# Patient Record
Sex: Female | Born: 1980 | Hispanic: No | Marital: Married | Smoking: Never smoker
Health system: Southern US, Community
[De-identification: ages and names within clinical notes are randomized; demographics above are authoritative.]

## PROBLEM LIST (undated history)

## (undated) DIAGNOSIS — F32A Depression, unspecified: Secondary | ICD-10-CM

## (undated) DIAGNOSIS — F329 Major depressive disorder, single episode, unspecified: Secondary | ICD-10-CM

## (undated) HISTORY — DX: Major depressive disorder, single episode, unspecified: F32.9

## (undated) HISTORY — DX: Depression, unspecified: F32.A

## (undated) HISTORY — PX: NO PAST SURGERIES: SHX2092

## (undated) HISTORY — PX: OTHER SURGICAL HISTORY: SHX169

---

## 2004-05-31 ENCOUNTER — Encounter: Admission: RE | Admit: 2004-05-31 | Discharge: 2004-05-31 | Payer: Self-pay | Admitting: Specialist

## 2006-09-26 ENCOUNTER — Ambulatory Visit (HOSPITAL_COMMUNITY): Admission: RE | Admit: 2006-09-26 | Discharge: 2006-09-26 | Payer: Self-pay | Admitting: Obstetrics and Gynecology

## 2014-11-10 ENCOUNTER — Other Ambulatory Visit: Payer: Self-pay | Admitting: *Deleted

## 2014-11-10 DIAGNOSIS — Z3490 Encounter for supervision of normal pregnancy, unspecified, unspecified trimester: Secondary | ICD-10-CM

## 2014-11-10 NOTE — Progress Notes (Signed)
Spoke with patients husband who stated that patients lmp is 09/26/14. Ultrasound scheduled for 11/18/13. Husband aware. Front desk to call patient with ob appointment.

## 2014-11-14 NOTE — L&D Delivery Note (Signed)
Patient is 34 y.o. [id number] [redacted]w[redacted]d admitted for labor, no significant medical history.   Delivery Note At 2:42 PM a viable female was delivered via Vaginal, Spontaneous Delivery (Presentation: Right Occiput Anterior).  APGAR: 9, 9; weight not yet measured.   Placenta status: Intact, Spontaneous.  Cord: 3 vessels with the following complications: None.  Cord pH: not sent.  Anesthesia: Epidural  Episiotomy: None Lacerations: 2nd degree Suture Repair: 3.0 vicryl Est. Blood Loss (mL): 125  Mom to postpartum.  Baby to Couplet care / Skin to Skin.  Tarri Abernethy 07/07/2015, 4:03 PM      Upon arrival patient was complete and pushing. She pushed with good maternal effort to deliver a healthy baby boy. Baby delivered without difficulty, was noted to have good tone and place on maternal abdomen for oral suctioning, drying and stimulation. Delayed cord clamping performed. Placenta delivered intact with 3V cord. Vaginal canal and perineum was inspected. Second degree perineal laceration was repaired with 3.0 vicryl. Pitocin was started and uterus massaged until bleeding slowed. Counts of sharps, instruments, and lap pads were all correct.   Tarri Abernethy, MD PGY-1 [facility] 07/07/2015

## 2014-11-17 ENCOUNTER — Encounter: Payer: Self-pay | Admitting: *Deleted

## 2014-11-18 ENCOUNTER — Ambulatory Visit (HOSPITAL_COMMUNITY): Payer: Self-pay

## 2014-11-20 ENCOUNTER — Encounter (HOSPITAL_COMMUNITY): Payer: Self-pay

## 2014-11-20 ENCOUNTER — Ambulatory Visit (HOSPITAL_COMMUNITY)
Admission: RE | Admit: 2014-11-20 | Discharge: 2014-11-20 | Disposition: A | Discharge status: Home / Self Care | Payer: BLUE CROSS/BLUE SHIELD | Source: Ambulatory Visit | Attending: Family Medicine | Admitting: Family Medicine

## 2014-11-20 DIAGNOSIS — O26891 Other specified pregnancy related conditions, first trimester: Secondary | ICD-10-CM | POA: Insufficient documentation

## 2014-11-20 DIAGNOSIS — Z36 Encounter for antenatal screening of mother: Secondary | ICD-10-CM | POA: Insufficient documentation

## 2014-11-20 DIAGNOSIS — Z3490 Encounter for supervision of normal pregnancy, unspecified, unspecified trimester: Secondary | ICD-10-CM

## 2014-11-20 DIAGNOSIS — R102 Pelvic and perineal pain: Secondary | ICD-10-CM | POA: Diagnosis not present

## 2014-12-17 ENCOUNTER — Encounter: Payer: Self-pay | Admitting: Advanced Practice Midwife

## 2014-12-17 ENCOUNTER — Ambulatory Visit (INDEPENDENT_AMBULATORY_CARE_PROVIDER_SITE_OTHER): Payer: BLUE CROSS/BLUE SHIELD | Admitting: Advanced Practice Midwife

## 2014-12-17 VITALS — BP 96/64 | HR 82 | Temp 97.8°F | Ht 63.0 in | Wt 126.4 lb

## 2014-12-17 DIAGNOSIS — O3680X1 Pregnancy with inconclusive fetal viability, fetus 1: Secondary | ICD-10-CM

## 2014-12-17 DIAGNOSIS — Z3401 Encounter for supervision of normal first pregnancy, first trimester: Secondary | ICD-10-CM

## 2014-12-17 DIAGNOSIS — Z3480 Encounter for supervision of other normal pregnancy, unspecified trimester: Secondary | ICD-10-CM | POA: Insufficient documentation

## 2014-12-17 DIAGNOSIS — Z3481 Encounter for supervision of other normal pregnancy, first trimester: Secondary | ICD-10-CM

## 2014-12-17 DIAGNOSIS — Z3400 Encounter for supervision of normal first pregnancy, unspecified trimester: Secondary | ICD-10-CM | POA: Insufficient documentation

## 2014-12-17 LAB — POCT URINALYSIS DIP (DEVICE)
BILIRUBIN URINE: NEGATIVE
Glucose, UA: NEGATIVE mg/dL
Ketones, ur: NEGATIVE mg/dL
LEUKOCYTES UA: NEGATIVE
Nitrite: NEGATIVE
PROTEIN: NEGATIVE mg/dL
Specific Gravity, Urine: 1.02 (ref 1.005–1.030)
UROBILINOGEN UA: 1 mg/dL (ref 0.0–1.0)
pH: 7 (ref 5.0–8.0)

## 2014-12-17 LAB — US OB COMP LESS 14 WKS

## 2014-12-17 MED ORDER — CVS PRENATAL GUMMY 0.4-113.5 MG PO CHEW: 2.0000 | CHEWABLE_TABLET | Freq: Every day | ORAL | Status: DC

## 2014-12-17 NOTE — Progress Notes (Signed)
Initial OB appt. New OB educational material given Labs, Flu vaccine 09/2014 Interpreter present for encounter.  Discussed with patient that this is a teaching facility and that during their pregnancy there may be female physicians and other healthcare providers involved in their care. This includes, but is not limited to, prenatal visits and ultrasound examinations, as well as, the labor and delivery process and postpartum care. Also discussed with patient that they do have the right to transfer their care to another practice in the event that they do not agree or wish to see female providers under any situation. Informed patient that we will make every attempt to have a female provider care for them, though this cannot be guaranteed, such as in an emergent situation. Also, reminded patient that when they are scheduling their appointments, to request a female provider each time and we will try to accommodate their request.

## 2014-12-17 NOTE — Patient Instructions (Signed)
Nausea medication to take during pregnancy:  ° °Unisom (doxylamine succinate 25 mg tablets) Take one tablet daily at bedtime. If symptoms are not adequately controlled, the dose can be increased to a maximum recommended dose of two tablets daily (1/2 tablet in the morning, 1/2 tablet mid-afternoon and one at bedtime). ° °Vitamin B6 100mg tablets. Take one tablet twice a day (up to 200 mg per day). ° ° ° °Morning Sickness °Morning sickness is when you feel sick to your stomach (nauseous) during pregnancy. This nauseous feeling may or may not come with vomiting. It often occurs in the morning but can be a problem any time of day. Morning sickness is most common during the first trimester, but it may continue throughout pregnancy. While morning sickness is unpleasant, it is usually harmless unless you develop severe and continual vomiting (hyperemesis gravidarum). This condition requires more intense treatment.  °CAUSES  °The cause of morning sickness is not completely known but seems to be related to normal hormonal changes that occur in pregnancy. °RISK FACTORS °You are at greater risk if you: °· Experienced nausea or vomiting before your pregnancy. °· Had morning sickness during a previous pregnancy. °· Are pregnant with more than one baby, such as twins. °TREATMENT  °Do not use any medicines (prescription, over-the-counter, or herbal) for morning sickness without first talking to your health care provider. Your health care provider may prescribe or recommend: °· Vitamin B6 supplements. °· Anti-nausea medicines. °· The herbal medicine ginger. °HOME CARE INSTRUCTIONS  °· Only take over-the-counter or prescription medicines as directed by your health care provider. °· Taking multivitamins before getting pregnant can prevent or decrease the severity of morning sickness in most women. °· Eat a piece of dry toast or unsalted crackers before getting out of bed in the morning. °· Eat five or six small meals a day. °· Eat  dry and bland foods (rice, baked potato). Foods high in carbohydrates are often helpful. °· Do not drink liquids with your meals. Drink liquids between meals. °· Avoid greasy, fatty, and spicy foods. °· Get someone to cook for you if the smell of any food causes nausea and vomiting. °· If you feel nauseous after taking prenatal vitamins, take the vitamins at night or with a snack.  °· Snack on protein foods (nuts, yogurt, cheese) between meals if you are hungry. °· Eat unsweetened gelatins for desserts. °· Wearing an acupressure wristband (worn for sea sickness) may be helpful. °· Acupuncture may be helpful. °· Do not smoke. °· Get a humidifier to keep the air in your house free of odors. °· Get plenty of fresh air. °SEEK MEDICAL CARE IF:  °· Your home remedies are not working, and you need medicine. °· You feel dizzy or lightheaded. °· You are losing weight. °SEEK IMMEDIATE MEDICAL CARE IF:  °· You have persistent and uncontrolled nausea and vomiting. °· You pass out (faint). °MAKE SURE YOU: °· Understand these instructions. °· Will watch your condition. °· Will get help right away if you are not doing well or get worse. °Document Released: 12/22/2006 Document Revised: 11/05/2013 Document Reviewed: 04/17/2013 °ExitCare® Patient Information ©2015 [employer]. This information is not intended to replace advice given to you by your health care provider. Make sure you discuss any questions you have with your health care provider. ° °

## 2014-12-17 NOTE — Progress Notes (Signed)
   Subjective:    Jenna Duncan is a [id number] 5780w5d being seen today for her first obstetrical visit.  Her obstetrical history is significant for NSVD x 2. Patient does intend to breast feed. Pregnancy history fully reviewed.  Patient reports nausea.  Filed Vitals:   12/17/14 1024 12/17/14 1025  BP: 96/64   Pulse: 82   Temp: 97.8 F (36.6 C)   Height:  5\' 3"  (1.6 m)  Weight: 126 lb 6.4 oz (57.335 kg)     HISTORY: OB History  Gravida Para Term Preterm AB SAB TAB Ectopic Multiple Living  3 2 2       4     # Outcome Date GA Lbr Len/2nd Weight Sex Delivery Anes PTL Lv  3 Current           2 Term 02/06/07 4726w0d  7 lb 5 oz (3.317 kg) M Vag-Spont EPI N Y  1 Term 11/04/03 4026w0d  6 lb 5 oz (2.863 kg) F Vag-Spont  N Y     Past Medical History  Diagnosis Date  . Depression     During pregnancy   History reviewed. No pertinent past surgical history. History reviewed. No pertinent family history.   Exam    Uterus:   ~12 week size  Pelvic Exam:    Perineum: No Hemorrhoids, Normal Perineum   Vulva: normal   Vagina:  normal mucosa, normal discharge   pH:    Cervix: multiparous appearance, no bleeding following Pap and no lesions   Adnexa: normal adnexa and no mass, fullness, tenderness   Bony Pelvis: average  System: Breast:  normal appearance, no masses or tenderness   Skin: normal coloration and turgor, no rashes    Neurologic: oriented, normal, gait normal; reflexes normal and symmetric   Extremities: normal strength, tone, and muscle mass, ROM of all joints is normal   HEENT neck supple with midline trachea and thyroid without masses   Mouth/Teeth mucous membranes moist, pharynx normal without lesions and dental hygiene good   Neck supple and no masses   Cardiovascular: regular rate and rhythm   Respiratory:  appears well, vitals normal, no respiratory distress, acyanotic, normal RR, ear and throat exam is normal, neck free of mass or lymphadenopathy   Abdomen: soft,  non-tender; bowel sounds normal; no masses,  no organomegaly   Urinary: urethral meatus normal      Assessment:    Pregnancy: [id number] Patient Active Problem List   Diagnosis Date Noted  . Supervision of normal first pregnancy 12/17/2014        Plan:     Initial labs drawn. Prenatal vitamins. Discussed gummy vitamins while nausea is bothersome.   Problem list reviewed and updated. Genetic Screening discussed First Screen and Quad Screen: declined.  Ultrasound discussed; fetal survey: requested.  Follow up in 4 weeks. 50% of 30 min visit spent on counseling and coordination of care.     LEFTWICH-KIRBY, LISA 12/17/2014

## 2014-12-17 NOTE — Progress Notes (Signed)
Bedside US for viability - single IUP, FHR = 168, FM present.  Results given to Sharen CounterLisa Leftwich-Kirby, CNM

## 2014-12-18 LAB — PRENATAL PROFILE (SOLSTAS)
Antibody Screen: NEGATIVE
BASOS ABS: 0 10*3/uL (ref 0.0–0.1)
Basophils Relative: 0 % (ref 0–1)
Eosinophils Absolute: 0.2 10*3/uL (ref 0.0–0.7)
Eosinophils Relative: 2 % (ref 0–5)
HEMATOCRIT: 33.8 % — AB (ref 36.0–46.0)
HEMOGLOBIN: 11 g/dL — AB (ref 12.0–15.0)
HEP B S AG: NEGATIVE
HIV 1&2 Ab, 4th Generation: NONREACTIVE
LYMPHS PCT: 20 % (ref 12–46)
Lymphs Abs: 1.5 10*3/uL (ref 0.7–4.0)
MCH: 27.1 pg (ref 26.0–34.0)
MCHC: 32.5 g/dL (ref 30.0–36.0)
MCV: 83.3 fL (ref 78.0–100.0)
MONO ABS: 0.7 10*3/uL (ref 0.1–1.0)
MONOS PCT: 9 % (ref 3–12)
MPV: 9.7 fL (ref 8.6–12.4)
NEUTROS ABS: 5.3 10*3/uL (ref 1.7–7.7)
Neutrophils Relative %: 69 % (ref 43–77)
PLATELETS: 256 10*3/uL (ref 150–400)
RBC: 4.06 MIL/uL (ref 3.87–5.11)
RDW: 16.3 % — AB (ref 11.5–15.5)
RH TYPE: POSITIVE
RUBELLA: 1.41 {index} — AB (ref <0.90)
WBC: 7.7 10*3/uL (ref 4.0–10.5)

## 2014-12-18 LAB — PRESCRIPTION MONITORING PROFILE (19 PANEL)
AMPHETAMINE/METH: NEGATIVE ng/mL
BARBITURATE SCREEN, URINE: NEGATIVE ng/mL
BUPRENORPHINE, URINE: NEGATIVE ng/mL
Benzodiazepine Screen, Urine: NEGATIVE ng/mL
CANNABINOID SCRN UR: NEGATIVE ng/mL
CARISOPRODOL, URINE: NEGATIVE ng/mL
Cocaine Metabolites: NEGATIVE ng/mL
Creatinine, Urine: 216.13 mg/dL (ref >20.0)
ECSTASY: NEGATIVE ng/mL
FENTANYL URINE: NEGATIVE ng/mL
MEPERIDINE UR: NEGATIVE ng/mL
METHADONE SCREEN, URINE: NEGATIVE ng/mL
METHAQUALONE SCREEN (URINE): NEGATIVE ng/mL
NITRITES URINE, INITIAL: NEGATIVE ug/mL
OPIATE SCREEN, URINE: NEGATIVE ng/mL
Oxycodone Screen, Ur: NEGATIVE ng/mL
Phencyclidine, Ur: NEGATIVE ng/mL
Propoxyphene: NEGATIVE ng/mL
TAPENTADOLUR: NEGATIVE ng/mL
Tramadol Scrn, Ur: NEGATIVE ng/mL
Zolpidem, Urine: NEGATIVE ng/mL
pH, Initial: 7.3 pH (ref 4.5–8.9)

## 2014-12-19 ENCOUNTER — Encounter: Payer: Self-pay | Admitting: *Deleted

## 2014-12-19 DIAGNOSIS — Z3481 Encounter for supervision of other normal pregnancy, first trimester: Secondary | ICD-10-CM

## 2014-12-19 LAB — HEMOGLOBINOPATHY EVALUATION
HEMOGLOBIN OTHER: 0 %
HGB A2 QUANT: 2.5 % (ref 2.2–3.2)
HGB F QUANT: 0 % (ref 0.0–2.0)
Hgb A: 97.5 % (ref 96.8–97.8)
Hgb S Quant: 0 %

## 2014-12-21 LAB — CULTURE, OB URINE

## 2014-12-22 ENCOUNTER — Other Ambulatory Visit: Payer: Self-pay

## 2014-12-22 DIAGNOSIS — Z3401 Encounter for supervision of normal first pregnancy, first trimester: Secondary | ICD-10-CM

## 2014-12-22 LAB — CYTOLOGY - PAP

## 2014-12-22 MED ORDER — CVS PRENATAL GUMMY 0.4-113.5 MG PO CHEW: 2.0000 | CHEWABLE_TABLET | Freq: Every day | ORAL | Status: DC

## 2014-12-22 NOTE — Progress Notes (Signed)
Patient's husband called stating a RX was supposed to have been sent to [facility] on [address] [place], [place] but never was. Per chart review, PNV gummies were prescribed but printed. Medication re-prescribed to e-prescribe. Advised he call clinic if he has any problems-- also informed him that gummy vitamin can be bought OTC if he prefers.

## 2014-12-23 ENCOUNTER — Encounter: Payer: Self-pay | Admitting: Advanced Practice Midwife

## 2014-12-23 DIAGNOSIS — O2340 Unspecified infection of urinary tract in pregnancy, unspecified trimester: Secondary | ICD-10-CM

## 2014-12-23 DIAGNOSIS — B951 Streptococcus, group B, as the cause of diseases classified elsewhere: Secondary | ICD-10-CM | POA: Insufficient documentation

## 2015-01-07 ENCOUNTER — Encounter: Payer: Self-pay | Admitting: Obstetrics & Gynecology

## 2015-01-14 ENCOUNTER — Encounter: Payer: Self-pay | Admitting: Obstetrics and Gynecology

## 2015-01-14 ENCOUNTER — Ambulatory Visit (INDEPENDENT_AMBULATORY_CARE_PROVIDER_SITE_OTHER): Payer: BLUE CROSS/BLUE SHIELD | Admitting: Obstetrics and Gynecology

## 2015-01-14 VITALS — BP 101/58 | HR 82 | Wt 128.1 lb

## 2015-01-14 DIAGNOSIS — Z3482 Encounter for supervision of other normal pregnancy, second trimester: Secondary | ICD-10-CM

## 2015-01-14 LAB — POCT URINALYSIS DIP (DEVICE)
BILIRUBIN URINE: NEGATIVE
GLUCOSE, UA: NEGATIVE mg/dL
HGB URINE DIPSTICK: NEGATIVE
Ketones, ur: NEGATIVE mg/dL
Leukocytes, UA: NEGATIVE
NITRITE: NEGATIVE
Protein, ur: 30 mg/dL — AB
SPECIFIC GRAVITY, URINE: 1.025 (ref 1.005–1.030)
Urobilinogen, UA: 0.2 mg/dL (ref 0.0–1.0)
pH: 7 (ref 5.0–8.0)

## 2015-01-14 NOTE — Progress Notes (Signed)
Patient states she speaks english well and understands english and does not need an interpreter; declination of interpreter services signed with patient

## 2015-01-14 NOTE — Patient Instructions (Signed)
Second Trimester of Pregnancy The second trimester is from week 13 through week 28, months 4 through 6. The second trimester is often a time when you feel your best. Your body has also adjusted to being pregnant, and you begin to feel better physically. Usually, morning sickness has lessened or quit completely, you may have more energy, and you may have an increase in appetite. The second trimester is also a time when the fetus is growing rapidly. At the end of the sixth month, the fetus is about 9 inches long and weighs about 1 pounds. You will likely begin to feel the baby move (quickening) between 18 and 20 weeks of the pregnancy. BODY CHANGES Your body goes through many changes during pregnancy. The changes vary from woman to woman.   Your weight will continue to increase. You will notice your lower abdomen bulging out.  You may begin to get stretch marks on your hips, abdomen, and breasts.  You may develop headaches that can be relieved by medicines approved by your health care provider.  You may urinate more often because the fetus is pressing on your bladder.  You may develop or continue to have heartburn as a result of your pregnancy.  You may develop constipation because certain hormones are causing the muscles that push waste through your intestines to slow down.  You may develop hemorrhoids or swollen, bulging veins (varicose veins).  You may have back pain because of the weight gain and pregnancy hormones relaxing your joints between the bones in your pelvis and as a result of a shift in weight and the muscles that support your balance.  Your breasts will continue to grow and be tender.  Your gums may bleed and may be sensitive to brushing and flossing.  Dark spots or blotches (chloasma, mask of pregnancy) may develop on your face. This will likely fade after the baby is born.  A dark line from your belly button to the pubic area (linea nigra) may appear. This will likely fade  after the baby is born.  You may have changes in your hair. These can include thickening of your hair, rapid growth, and changes in texture. Some women also have hair loss during or after pregnancy, or hair that feels dry or thin. Your hair will most likely return to normal after your baby is born. WHAT TO EXPECT AT YOUR PRENATAL VISITS During a routine prenatal visit:  You will be weighed to make sure you and the fetus are growing normally.  Your blood pressure will be taken.  Your abdomen will be measured to track your baby's growth.  The fetal heartbeat will be listened to.  Any test results from the previous visit will be discussed. Your health care provider may ask you:  How you are feeling.  If you are feeling the baby move.  If you have had any abnormal symptoms, such as leaking fluid, bleeding, severe headaches, or abdominal cramping.  If you have any questions. Other tests that may be performed during your second trimester include:  Blood tests that check for:  Low iron levels (anemia).  Gestational diabetes (between 24 and 28 weeks).  Rh antibodies.  Urine tests to check for infections, diabetes, or protein in the urine.  An ultrasound to confirm the proper growth and development of the baby.  An amniocentesis to check for possible genetic problems.  Fetal screens for spina bifida and Down syndrome. HOME CARE INSTRUCTIONS   Avoid all smoking, herbs, alcohol, and unprescribed   drugs. These chemicals affect the formation and growth of the baby.  Follow your health care provider's instructions regarding medicine use. There are medicines that are either safe or unsafe to take during pregnancy.  Exercise only as directed by your health care provider. Experiencing uterine cramps is a good sign to stop exercising.  Continue to eat regular, healthy meals.  Wear a good support bra for breast tenderness.  Do not use hot tubs, steam rooms, or saunas.  Wear your  seat belt at all times when driving.  Avoid raw meat, uncooked cheese, cat litter boxes, and soil used by cats. These carry germs that can cause birth defects in the baby.  Take your prenatal vitamins.  Try taking a stool softener (if your health care provider approves) if you develop constipation. Eat more high-fiber foods, such as fresh vegetables or fruit and whole grains. Drink plenty of fluids to keep your urine clear or pale yellow.  Take warm sitz baths to soothe any pain or discomfort caused by hemorrhoids. Use hemorrhoid cream if your health care provider approves.  If you develop varicose veins, wear support hose. Elevate your feet for 15 minutes, 3-4 times a day. Limit salt in your diet.  Avoid heavy lifting, wear low heel shoes, and practice good posture.  Rest with your legs elevated if you have leg cramps or low back pain.  Visit your dentist if you have not gone yet during your pregnancy. Use a soft toothbrush to brush your teeth and be gentle when you floss.  A sexual relationship may be continued unless your health care provider directs you otherwise.  Continue to go to all your prenatal visits as directed by your health care provider. SEEK MEDICAL CARE IF:   You have dizziness.  You have mild pelvic cramps, pelvic pressure, or nagging pain in the abdominal area.  You have persistent nausea, vomiting, or diarrhea.  You have a bad smelling vaginal discharge.  You have pain with urination. SEEK IMMEDIATE MEDICAL CARE IF:   You have a fever.  You are leaking fluid from your vagina.  You have spotting or bleeding from your vagina.  You have severe abdominal cramping or pain.  You have rapid weight gain or loss.  You have shortness of breath with chest pain.  You notice sudden or extreme swelling of your face, hands, ankles, feet, or legs.  You have not felt your baby move in over an hour.  You have severe headaches that do not go away with  medicine.  You have vision changes. Document Released: 10/25/2001 Document Revised: 11/05/2013 Document Reviewed: 01/01/2013 ExitCare Patient Information 2015 [employer]. This information is not intended to replace advice given to you by your health care provider. Make sure you discuss any questions you have with your health care provider.  

## 2015-01-14 NOTE — Progress Notes (Signed)
[place], speaks [place]. Doing well except still having a little nausea. Sinus congestion discussed> try Claritin.  Anatomic US scheduled. Declines any genetic screening.

## 2015-01-15 ENCOUNTER — Encounter: Payer: Self-pay | Admitting: *Deleted

## 2015-01-26 ENCOUNTER — Telehealth: Payer: Self-pay | Admitting: *Deleted

## 2015-01-26 NOTE — Telephone Encounter (Signed)
Patient husband called stating patient is having pain in her feet at night please call her back

## 2015-01-26 NOTE — Telephone Encounter (Addendum)
Patients husband called and stated that patient is having leg pain. Would like a call back to see what to do about it. Called patient back and left voicemail that we are returning your call.   1450  I returned the call and spoke with pt's husband while pt was present. Pt has been having pain in both legs below the knee and also has some swelling.   I gave clinic appt for tomorrow and pt's husband agreed and voiced understanding.    Diane Day RNC

## 2015-01-27 ENCOUNTER — Ambulatory Visit (INDEPENDENT_AMBULATORY_CARE_PROVIDER_SITE_OTHER): Payer: BLUE CROSS/BLUE SHIELD | Admitting: Family

## 2015-01-27 VITALS — BP 102/57 | HR 77 | Temp 97.6°F | Wt 128.7 lb

## 2015-01-27 DIAGNOSIS — Z3482 Encounter for supervision of other normal pregnancy, second trimester: Secondary | ICD-10-CM

## 2015-01-27 LAB — POCT URINALYSIS DIP (DEVICE)
Bilirubin Urine: NEGATIVE
Glucose, UA: NEGATIVE mg/dL
HGB URINE DIPSTICK: NEGATIVE
KETONES UR: NEGATIVE mg/dL
LEUKOCYTES UA: NEGATIVE
NITRITE: NEGATIVE
PH: 7 (ref 5.0–8.0)
Protein, ur: NEGATIVE mg/dL
SPECIFIC GRAVITY, URINE: 1.025 (ref 1.005–1.030)
Urobilinogen, UA: 0.2 mg/dL (ref 0.0–1.0)

## 2015-01-27 MED ORDER — MEDICAL COMPRESSION PANTYHOSE MISC: Status: DC

## 2015-01-27 NOTE — Progress Notes (Signed)
Reports having pain in lower legs/feet while sleeping at night.  RX compression hose.  Ultrasound scheduled.  No additional questions or concerns.

## 2015-01-27 NOTE — Patient Instructions (Addendum)
  [facility] [address], [place], [place] [address] [phone]   

## 2015-02-11 ENCOUNTER — Ambulatory Visit (INDEPENDENT_AMBULATORY_CARE_PROVIDER_SITE_OTHER): Payer: BLUE CROSS/BLUE SHIELD | Admitting: Advanced Practice Midwife

## 2015-02-11 ENCOUNTER — Ambulatory Visit (HOSPITAL_COMMUNITY)
Admission: RE | Admit: 2015-02-11 | Discharge: 2015-02-11 | Disposition: A | Discharge status: Home / Self Care | Payer: BLUE CROSS/BLUE SHIELD | Source: Ambulatory Visit | Attending: Obstetrics and Gynecology | Admitting: Obstetrics and Gynecology

## 2015-02-11 VITALS — BP 98/66 | HR 79 | Wt 135.1 lb

## 2015-02-11 DIAGNOSIS — Z3A19 19 weeks gestation of pregnancy: Secondary | ICD-10-CM | POA: Insufficient documentation

## 2015-02-11 DIAGNOSIS — Z3482 Encounter for supervision of other normal pregnancy, second trimester: Secondary | ICD-10-CM

## 2015-02-11 DIAGNOSIS — Z36 Encounter for antenatal screening of mother: Secondary | ICD-10-CM | POA: Diagnosis present

## 2015-02-11 DIAGNOSIS — Z3689 Encounter for other specified antenatal screening: Secondary | ICD-10-CM | POA: Insufficient documentation

## 2015-02-11 LAB — POCT URINALYSIS DIP (DEVICE)
BILIRUBIN URINE: NEGATIVE
GLUCOSE, UA: NEGATIVE mg/dL
Hgb urine dipstick: NEGATIVE
KETONES UR: NEGATIVE mg/dL
NITRITE: NEGATIVE
PH: 6.5 (ref 5.0–8.0)
PROTEIN: NEGATIVE mg/dL
SPECIFIC GRAVITY, URINE: 1.01 (ref 1.005–1.030)
Urobilinogen, UA: 0.2 mg/dL (ref 0.0–1.0)

## 2015-02-11 NOTE — Progress Notes (Signed)
Trace leuks on UA 

## 2015-02-11 NOTE — Progress Notes (Signed)
Doing well.  Good fetal movement, denies vaginal bleeding, LOF,cramping/contractions.  Reviewed anatomy U/S today, wnl with limited views of mouth. Repeat in 6 weeks to complete anatomy.

## 2015-02-12 NOTE — Addendum Note (Signed)
Encounter addended by: Danae Orleanseirdre C Belmira Daley, CNM on: 02/12/2015  8:33 AM<BR>     Documentation filed: Message, Problem List

## 2015-03-04 ENCOUNTER — Ambulatory Visit (INDEPENDENT_AMBULATORY_CARE_PROVIDER_SITE_OTHER): Payer: BLUE CROSS/BLUE SHIELD | Admitting: Physician Assistant

## 2015-03-04 ENCOUNTER — Encounter: Payer: Self-pay | Admitting: Physician Assistant

## 2015-03-04 VITALS — BP 102/61 | HR 92 | Temp 98.0°F | Wt 140.0 lb

## 2015-03-04 DIAGNOSIS — Z3482 Encounter for supervision of other normal pregnancy, second trimester: Secondary | ICD-10-CM

## 2015-03-04 LAB — POCT URINALYSIS DIP (DEVICE)
GLUCOSE, UA: NEGATIVE mg/dL
HGB URINE DIPSTICK: NEGATIVE
Ketones, ur: NEGATIVE mg/dL
LEUKOCYTES UA: NEGATIVE
NITRITE: NEGATIVE
PH: 7.5 (ref 5.0–8.0)
Protein, ur: 30 mg/dL — AB
Specific Gravity, Urine: 1.02 (ref 1.005–1.030)
UROBILINOGEN UA: 0.2 mg/dL (ref 0.0–1.0)

## 2015-03-04 NOTE — Progress Notes (Signed)
Ultrasound scheduled 5/16 @ 930

## 2015-03-04 NOTE — Progress Notes (Signed)
22 weeks, stable.  Denies LOF, VB, dysuria.  Endorses fetal movement.  Continues to have pain in bilat feet from ankles to toes.  She has not trialed compression hose (given rx on 3/15).   OB u/s ordered to complete anatomy RTC 4 weeks.  Do good stretching of lower extremities prior to bedtime and wear compression hose.

## 2015-03-04 NOTE — Patient Instructions (Signed)

## 2015-03-30 ENCOUNTER — Ambulatory Visit (HOSPITAL_COMMUNITY): Payer: BLUE CROSS/BLUE SHIELD

## 2015-03-31 ENCOUNTER — Ambulatory Visit (INDEPENDENT_AMBULATORY_CARE_PROVIDER_SITE_OTHER): Payer: BLUE CROSS/BLUE SHIELD | Admitting: Family

## 2015-03-31 ENCOUNTER — Ambulatory Visit (HOSPITAL_COMMUNITY)
Admission: RE | Admit: 2015-03-31 | Discharge: 2015-03-31 | Disposition: A | Discharge status: Home / Self Care | Payer: BLUE CROSS/BLUE SHIELD | Source: Ambulatory Visit | Attending: Physician Assistant | Admitting: Physician Assistant

## 2015-03-31 VITALS — BP 98/63 | HR 86 | Wt 144.9 lb

## 2015-03-31 DIAGNOSIS — Z3482 Encounter for supervision of other normal pregnancy, second trimester: Secondary | ICD-10-CM | POA: Insufficient documentation

## 2015-03-31 LAB — POCT URINALYSIS DIP (DEVICE)
Bilirubin Urine: NEGATIVE
GLUCOSE, UA: NEGATIVE mg/dL
Hgb urine dipstick: NEGATIVE
Ketones, ur: NEGATIVE mg/dL
NITRITE: NEGATIVE
Protein, ur: NEGATIVE mg/dL
Specific Gravity, Urine: 1.02 (ref 1.005–1.030)
UROBILINOGEN UA: 0.2 mg/dL (ref 0.0–1.0)
pH: 7 (ref 5.0–8.0)

## 2015-03-31 NOTE — Progress Notes (Signed)
Pt unable to do 1 hour today will do lab only visit in next couple of weeks.

## 2015-03-31 NOTE — Progress Notes (Signed)
Doing well; no questions or concerns.  Follow-up ultrasound scheduled for today.

## 2015-04-14 ENCOUNTER — Other Ambulatory Visit: Payer: BLUE CROSS/BLUE SHIELD

## 2015-04-14 DIAGNOSIS — Z3483 Encounter for supervision of other normal pregnancy, third trimester: Secondary | ICD-10-CM

## 2015-04-15 LAB — GLUCOSE TOLERANCE, 1 HOUR (50G) W/O FASTING: GLUCOSE 1 HOUR GTT: 66 mg/dL — AB (ref 70–140)

## 2015-04-15 LAB — CBC
HEMATOCRIT: 31.5 % — AB (ref 36.0–46.0)
Hemoglobin: 10.3 g/dL — ABNORMAL LOW (ref 12.0–15.0)
MCH: 28.9 pg (ref 26.0–34.0)
MCHC: 32.7 g/dL (ref 30.0–36.0)
MCV: 88.2 fL (ref 78.0–100.0)
MPV: 9.4 fL (ref 8.6–12.4)
PLATELETS: 225 10*3/uL (ref 150–400)
RBC: 3.57 MIL/uL — ABNORMAL LOW (ref 3.87–5.11)
RDW: 16.5 % — ABNORMAL HIGH (ref 11.5–15.5)
WBC: 8.2 10*3/uL (ref 4.0–10.5)

## 2015-04-15 LAB — RPR

## 2015-04-15 LAB — HIV ANTIBODY (ROUTINE TESTING W REFLEX): HIV 1&2 Ab, 4th Generation: NONREACTIVE

## 2015-04-28 ENCOUNTER — Encounter: Payer: Self-pay | Admitting: Advanced Practice Midwife

## 2015-04-28 ENCOUNTER — Ambulatory Visit (INDEPENDENT_AMBULATORY_CARE_PROVIDER_SITE_OTHER): Payer: BLUE CROSS/BLUE SHIELD | Admitting: Advanced Practice Midwife

## 2015-04-28 VITALS — BP 97/62 | HR 83 | Temp 98.6°F | Wt 152.9 lb

## 2015-04-28 DIAGNOSIS — Z23 Encounter for immunization: Secondary | ICD-10-CM

## 2015-04-28 DIAGNOSIS — Z3483 Encounter for supervision of other normal pregnancy, third trimester: Secondary | ICD-10-CM

## 2015-04-28 LAB — POCT URINALYSIS DIP (DEVICE)
Bilirubin Urine: NEGATIVE
GLUCOSE, UA: NEGATIVE mg/dL
Hgb urine dipstick: NEGATIVE
Ketones, ur: NEGATIVE mg/dL
Nitrite: NEGATIVE
PROTEIN: NEGATIVE mg/dL
Specific Gravity, Urine: 1.02 (ref 1.005–1.030)
UROBILINOGEN UA: 0.2 mg/dL (ref 0.0–1.0)
pH: 7.5 (ref 5.0–8.0)

## 2015-04-28 MED ORDER — TETANUS-DIPHTH-ACELL PERTUSSIS 5-2.5-18.5 LF-MCG/0.5 IM SUSP
0.5000 mL | Freq: Once | INTRAMUSCULAR | Status: AC
  Administered 2015-04-28: 0.5 mL via INTRAMUSCULAR

## 2015-04-28 NOTE — Addendum Note (Signed)
Addended by: Louanna Raw on: 04/28/2015 10:24 AM   Modules accepted: Orders

## 2015-04-28 NOTE — Progress Notes (Addendum)
Reviewed 28 week labs. TDaP today. F/U US complete.

## 2015-04-28 NOTE — Patient Instructions (Addendum)
Tdap Vaccine (Tetanus, Diphtheria, Pertussis): What You Need to Know 1. Why get vaccinated? Tetanus, diphtheria and pertussis can be very serious diseases, even for adolescents and adults. Tdap vaccine can protect us from these diseases. TETANUS (Lockjaw) causes painful muscle tightening and stiffness, usually all over the body.  It can lead to tightening of muscles in the head and neck so you can't open your mouth, swallow, or sometimes even breathe. Tetanus kills about 1 out of 5 people who are infected. DIPHTHERIA can cause a thick coating to form in the back of the throat.  It can lead to breathing problems, paralysis, heart failure, and death. PERTUSSIS (Whooping Cough) causes severe coughing spells, which can cause difficulty breathing, vomiting and disturbed sleep.  It can also lead to weight loss, incontinence, and rib fractures. Up to 2 in 100 adolescents and 5 in 100 adults with pertussis are hospitalized or have complications, which could include pneumonia or death. These diseases are caused by bacteria. Diphtheria and pertussis are spread from person to person through coughing or sneezing. Tetanus enters the body through cuts, scratches, or wounds. Before vaccines, the [place] saw as many as 200,000 cases a year of diphtheria and pertussis, and hundreds of cases of tetanus. Since vaccination began, tetanus and diphtheria have dropped by about 99% and pertussis by about 80%. 2. Tdap vaccine Tdap vaccine can protect adolescents and adults from tetanus, diphtheria, and pertussis. One dose of Tdap is routinely given at age 11 or 12. People who did not get Tdap at that age should get it as soon as possible. Tdap is especially important for health care professionals and anyone having close contact with a baby younger than 12 months. Pregnant women should get a dose of Tdap during every pregnancy, to protect the newborn from pertussis. Infants are most at risk for severe, life-threatening  complications from pertussis. A similar vaccine, called Td, protects from tetanus and diphtheria, but not pertussis. A Td booster should be given every 10 years. Tdap may be given as one of these boosters if you have not already gotten a dose. Tdap may also be given after a severe cut or burn to prevent tetanus infection. Your doctor can give you more information. Tdap may safely be given at the same time as other vaccines. 3. Some people should not get this vaccine  If you ever had a life-threatening allergic reaction after a dose of any tetanus, diphtheria, or pertussis containing vaccine, OR if you have a severe allergy to any part of this vaccine, you should not get Tdap. Tell your doctor if you have any severe allergies.  If you had a coma, or long or multiple seizures within 7 days after a childhood dose of DTP or DTaP, you should not get Tdap, unless a cause other than the vaccine was found. You can still get Td.  Talk to your doctor if you:  have epilepsy or another nervous system problem,  had severe pain or swelling after any vaccine containing diphtheria, tetanus or pertussis,  ever had Guillain-Barr Syndrome (GBS),  aren't feeling well on the day the shot is scheduled. 4. Risks of a vaccine reaction With any medicine, including vaccines, there is a chance of side effects. These are usually mild and go away on their own, but serious reactions are also possible. Brief fainting spells can follow a vaccination, leading to injuries from falling. Sitting or lying down for about 15 minutes can help prevent these. Tell your doctor if you feel   dizzy or light-headed, or have vision changes or ringing in the ears. Mild problems following Tdap (Did not interfere with activities)  Pain where the shot was given (about 3 in 4 adolescents or 2 in 3 adults)  Redness or swelling where the shot was given (about 1 person in 5)  Mild fever of at least 100.56F (up to about 1 in 25 adolescents or  1 in 100 adults)  Headache (about 3 or 4 people in 10)  Tiredness (about 1 person in 3 or 4)  Nausea, vomiting, diarrhea, stomach ache (up to 1 in 4 adolescents or 1 in 10 adults)  Chills, body aches, sore joints, rash, swollen glands (uncommon) Moderate problems following Tdap (Interfered with activities, but did not require medical attention)  Pain where the shot was given (about 1 in 5 adolescents or 1 in 100 adults)  Redness or swelling where the shot was given (up to about 1 in 16 adolescents or 1 in 25 adults)  Fever over 102F (about 1 in 100 adolescents or 1 in 250 adults)  Headache (about 3 in 20 adolescents or 1 in 10 adults)  Nausea, vomiting, diarrhea, stomach ache (up to 1 or 3 people in 100)  Swelling of the entire arm where the shot was given (up to about 3 in 100). Severe problems following Tdap (Unable to perform usual activities; required medical attention)  Swelling, severe pain, bleeding and redness in the arm where the shot was given (rare). A severe allergic reaction could occur after any vaccine (estimated less than 1 in a million doses). 5. What if there is a serious reaction? What should I look for?  Look for anything that concerns you, such as signs of a severe allergic reaction, very high fever, or behavior changes. Signs of a severe allergic reaction can include hives, swelling of the face and throat, difficulty breathing, a fast heartbeat, dizziness, and weakness. These would start a few minutes to a few hours after the vaccination. What should I do?  If you think it is a severe allergic reaction or other emergency that can't wait, call 9-1-1 or get the person to the nearest hospital. Otherwise, call your doctor.  Afterward, the reaction should be reported to the "Vaccine Adverse Event Reporting System" (VAERS). Your doctor might file this report, or you can do it yourself through the VAERS web site at www.vaers.LAgents.no, or by calling  1-401-639-5842. VAERS is only for reporting reactions. They do not give medical advice.  6. The National Vaccine Injury Compensation Program The [employer] Vaccine Injury Compensation Program (VICP) is a federal program that was created to compensate people who may have been injured by certain vaccines. Persons who believe they may have been injured by a vaccine can learn about the program and about filing a claim by calling 1-478-767-0610 or visiting the VICP website at SpiritualWord.at. 7. How can I learn more?  Ask your doctor.  Call your local or state health department.  Contact the Centers for Disease Control and Prevention (CDC):  Call 315-563-6003 or visit CDC's website at PicCapture.uy. CDC Tdap Vaccine VIS (03/22/12) Document Released: 05/01/2012 Document Revised: 03/17/2014 Document Reviewed: 02/12/2014 ExitCare Patient Information 2015 [place], [place]. This information is not intended to replace advice given to you by your health care provider. Make sure you discuss any questions you have with your health care provider.   Contraception Choices Contraception (birth control) is the use of any methods or devices to prevent pregnancy. Below are some methods to help avoid  pregnancy. HORMONAL METHODS   Contraceptive implant. This is a thin, plastic tube containing progesterone hormone. It does not contain estrogen hormone. Your health care provider inserts the tube in the inner part of the upper arm. The tube can remain in place for up to 3 years. After 3 years, the implant must be removed. The implant prevents the ovaries from releasing an egg (ovulation), thickens the cervical mucus to prevent sperm from entering the uterus, and thins the lining of the inside of the uterus.  Progesterone-only injections. These injections are given every 3 months by your health care provider to prevent pregnancy. This synthetic progesterone hormone stops the ovaries from  releasing eggs. It also thickens cervical mucus and changes the uterine lining. This makes it harder for sperm to survive in the uterus.  Birth control pills. These pills contain estrogen and progesterone hormone. They work by preventing the ovaries from releasing eggs (ovulation). They also cause the cervical mucus to thicken, preventing the sperm from entering the uterus. Birth control pills are prescribed by a health care provider.Birth control pills can also be used to treat heavy periods.  Minipill. This type of birth control pill contains only the progesterone hormone. They are taken every day of each month and must be prescribed by your health care provider.  Birth control patch. The patch contains hormones similar to those in birth control pills. It must be changed once a week and is prescribed by a health care provider.  Vaginal ring. The ring contains hormones similar to those in birth control pills. It is left in the vagina for 3 weeks, removed for 1 week, and then a new one is put back in place. The patient must be comfortable inserting and removing the ring from the vagina.A health care provider's prescription is necessary.  Emergency contraception. Emergency contraceptives prevent pregnancy after unprotected sexual intercourse. This pill can be taken right after sex or up to 5 days after unprotected sex. It is most effective the sooner you take the pills after having sexual intercourse. Most emergency contraceptive pills are available without a prescription. Check with your pharmacist. Do not use emergency contraception as your only form of birth control. BARRIER METHODS   Female condom. This is a thin sheath (latex or rubber) that is worn over the penis during sexual intercourse. It can be used with spermicide to increase effectiveness.  Female condom. This is a soft, loose-fitting sheath that is put into the vagina before sexual intercourse.  Diaphragm. This is a soft, latex,  dome-shaped barrier that must be fitted by a health care provider. It is inserted into the vagina, along with a spermicidal jelly. It is inserted before intercourse. The diaphragm should be left in the vagina for 6 to 8 hours after intercourse.  Cervical cap. This is a round, soft, latex or plastic cup that fits over the cervix and must be fitted by a health care provider. The cap can be left in place for up to 48 hours after intercourse.  Sponge. This is a soft, circular piece of polyurethane foam. The sponge has spermicide in it. It is inserted into the vagina after wetting it and before sexual intercourse.  Spermicides. These are chemicals that kill or block sperm from entering the cervix and uterus. They come in the form of creams, jellies, suppositories, foam, or tablets. They do not require a prescription. They are inserted into the vagina with an applicator before having sexual intercourse. The process must be repeated every time you  have sexual intercourse. INTRAUTERINE CONTRACEPTION  Intrauterine device (IUD). This is a T-shaped device that is put in a woman's uterus during a menstrual period to prevent pregnancy. There are 2 types:  Copper IUD. This type of IUD is wrapped in copper wire and is placed inside the uterus. Copper makes the uterus and fallopian tubes produce a fluid that kills sperm. It can stay in place for 10 years.  Hormone IUD. This type of IUD contains the hormone progestin (synthetic progesterone). The hormone thickens the cervical mucus and prevents sperm from entering the uterus, and it also thins the uterine lining to prevent implantation of a fertilized egg. The hormone can weaken or kill the sperm that get into the uterus. It can stay in place for 3-5 years, depending on which type of IUD is used. PERMANENT METHODS OF CONTRACEPTION  Female tubal ligation. This is when the woman's fallopian tubes are surgically sealed, tied, or blocked to prevent the egg from traveling  to the uterus.  Hysteroscopic sterilization. This involves placing a small coil or insert into each fallopian tube. Your doctor uses a technique called hysteroscopy to do the procedure. The device causes scar tissue to form. This results in permanent blockage of the fallopian tubes, so the sperm cannot fertilize the egg. It takes about 3 months after the procedure for the tubes to become blocked. You must use another form of birth control for these 3 months.  Female sterilization. This is when the female has the tubes that carry sperm tied off (vasectomy).This blocks sperm from entering the vagina during sexual intercourse. After the procedure, the man can still ejaculate fluid (semen). NATURAL PLANNING METHODS  Natural family planning. This is not having sexual intercourse or using a barrier method (condom, diaphragm, cervical cap) on days the woman could become pregnant.  Calendar method. This is keeping track of the length of each menstrual cycle and identifying when you are fertile.  Ovulation method. This is avoiding sexual intercourse during ovulation.  Symptothermal method. This is avoiding sexual intercourse during ovulation, using a thermometer and ovulation symptoms.  Post-ovulation method. This is timing sexual intercourse after you have ovulated. Regardless of which type or method of contraception you choose, it is important that you use condoms to protect against the transmission of sexually transmitted infections (STIs). Talk with your health care provider about which form of contraception is most appropriate for you. Document Released: 10/31/2005 Document Revised: 11/05/2013 Document Reviewed: 04/25/2013 [facility] Patient Information 2015 [place], [place]. This information is not intended to replace advice given to you by your health care provider. Make sure you discuss any questions you have with your health care provider.

## 2015-05-12 ENCOUNTER — Encounter: Payer: Self-pay | Admitting: Obstetrics & Gynecology

## 2015-05-12 ENCOUNTER — Ambulatory Visit (INDEPENDENT_AMBULATORY_CARE_PROVIDER_SITE_OTHER): Payer: BLUE CROSS/BLUE SHIELD | Admitting: Obstetrics & Gynecology

## 2015-05-12 VITALS — BP 99/64 | HR 84 | Temp 97.8°F | Wt 155.5 lb

## 2015-05-12 DIAGNOSIS — Z3490 Encounter for supervision of normal pregnancy, unspecified, unspecified trimester: Secondary | ICD-10-CM

## 2015-05-12 LAB — POCT URINALYSIS DIP (DEVICE)
BILIRUBIN URINE: NEGATIVE
Glucose, UA: NEGATIVE mg/dL
Hgb urine dipstick: NEGATIVE
KETONES UR: NEGATIVE mg/dL
Nitrite: NEGATIVE
Protein, ur: 30 mg/dL — AB
Specific Gravity, Urine: 1.025 (ref 1.005–1.030)
Urobilinogen, UA: 1 mg/dL (ref 0.0–1.0)
pH: 7 (ref 5.0–8.0)

## 2015-05-12 NOTE — Patient Instructions (Signed)
Group B Streptococcus Infection During Pregnancy Group B streptococcus (GBS) is a type of bacteria often found in healthy women. GBS is not the same as the bacteria that causes strep throat. You may have GBS in your vagina, rectum, or bladder. GBS does not spread through sexual contact, but it can be passed to a baby during childbirth. This can be dangerous for your baby. It is not dangerous to you and usually does not cause any symptoms. Your health care provider may test you for GBS when your pregnancy is between 35 and 37 weeks. GBS is dangerous only during birth, so there is no need to test for it earlier. It is possible to have GBS during pregnancy and never pass it to your baby. If your test results are positive for GBS, your health care provider may recommend giving you antibiotic medicine during delivery to make sure your baby stays healthy. RISK FACTORS You are more likely to pass GBS to your baby if:   Your water breaks (ruptured membrane) or you go into labor before 37 weeks.  Your water breaks 18 hours before you deliver.  You passed GBS during a previous pregnancy.  You have a urinary tract infection caused by GBS any time during pregnancy.  You have a fever during labor. SYMPTOMS Most women who have GBS do not have any symptoms. If you have a urinary tract infection caused by GBS, you might have frequent or painful urination and fever. Babies who get GBS usually show symptoms within 7 days of birth. Symptoms may include:   Breathing problems.  Heart and blood pressure problems.  Digestive and kidney problems. DIAGNOSIS Routine screening for GBS is recommended for all pregnant women. A health care provider takes a sample of the fluid in your vagina and rectum with a swab. It is then sent to a lab to be checked for GBS. A sample of your urine may also be checked for the bacteria.  TREATMENT If you test positive for GBS, you may need treatment with an antibiotic medicine during  labor. As soon as you go into labor, or as soon as your membranes rupture, you will get the antibiotic medicine through an IV access. You will continue to get the medicine until after you give birth. You do not need antibiotic medicine if you are having a cesarean delivery.If your baby shows signs or symptoms of GBS after birth, your baby can also be treated with an antibiotic medicine. HOME CARE INSTRUCTIONS   Take all antibiotic medicine as prescribed by your health care provider. Only take medicine as directed.   Continue with prenatal visits and care.   Keep all follow-up appointments.  SEEK MEDICAL CARE IF:   You have pain when you urinate.   You have to urinate frequently.   You have a fever.  SEEK IMMEDIATE MEDICAL CARE IF:   Your membranes rupture.  You go into labor. Document Released: 02/07/2008 Document Revised: 11/05/2013 Document Reviewed: 08/23/2013 ExitCare Patient Information 2015 [employer]. This information is not intended to replace advice given to you by your health care provider. Make sure you discuss any questions you have with your health care provider.  

## 2015-05-12 NOTE — Progress Notes (Signed)
Subjective:  Jenna Duncan is a 34 y.o. G3P2002 at 6958w4d being seen today for ongoing prenatal care.  Patient reports heartburn.  Contractions: Not present.  Vag. Bleeding: None. Movement: Present. Denies leaking of fluid. Took Zantac with relief of sx.  The following portions of the patient's history were reviewed and updated as appropriate: allergies, current medications, past family history, past medical history, past social history, past surgical history and problem list.   Objective:   Filed Vitals:   05/12/15 1503  BP: 99/64  Pulse: 84  Temp: 97.8 F (36.6 C)  Weight: 155 lb 8 oz (70.534 kg)    Fetal Status: Fetal Heart Rate (bpm): 156 Fundal Height: 34 cm Movement: Present     General:  Alert, oriented and cooperative. Patient is in no acute distress.  Skin: Skin is warm and dry. No rash noted.   Cardiovascular: Normal heart rate noted  Respiratory: Normal respiratory effort, no problems with respiration noted  Abdomen: Soft, gravid, appropriate for gestational age. Pain/Pressure: Absent     Vaginal: Vag. Bleeding: None.       Cervix: Not evaluated        Extremities: Normal range of motion.  Edema: None  Mental Status: Normal mood and affect. Normal behavior. Normal judgment and thought content.   Urinalysis: Urine Protein: 1+ Urine Glucose: Negative  Assessment and Plan:  Pregnancy: G3P2002 at 6858w4d  Supervision normal preg- no problems  Preterm labor symptoms and general obstetric precautions including but not limited to vaginal bleeding, contractions, leaking of fluid and fetal movement were reviewed in detail with the patient.  Please refer to After Visit Summary for other counseling recommendations.   Return in about 2 weeks (around 05/26/2015).   Willodean Rosenthalarolyn Harraway-Smith, MD

## 2015-05-28 ENCOUNTER — Ambulatory Visit (INDEPENDENT_AMBULATORY_CARE_PROVIDER_SITE_OTHER): Payer: BLUE CROSS/BLUE SHIELD | Admitting: Obstetrics and Gynecology

## 2015-05-28 VITALS — BP 88/62 | HR 85 | Temp 98.3°F | Wt 159.0 lb

## 2015-05-28 DIAGNOSIS — Z3483 Encounter for supervision of other normal pregnancy, third trimester: Secondary | ICD-10-CM

## 2015-05-28 LAB — POCT URINALYSIS DIP (DEVICE)
Bilirubin Urine: NEGATIVE
GLUCOSE, UA: NEGATIVE mg/dL
Ketones, ur: NEGATIVE mg/dL
Nitrite: NEGATIVE
PH: 7 (ref 5.0–8.0)
Protein, ur: NEGATIVE mg/dL
SPECIFIC GRAVITY, URINE: 1.02 (ref 1.005–1.030)
UROBILINOGEN UA: 0.2 mg/dL (ref 0.0–1.0)

## 2015-05-28 NOTE — Patient Instructions (Signed)
Etonogestrel implant What is this medicine? ETONOGESTREL (et oh noe JES trel) is a contraceptive (birth control) device. It is used to prevent pregnancy. It can be used for up to 3 years. This medicine may be used for other purposes; ask your health care provider or pharmacist if you have questions. COMMON BRAND NAME(S): Implanon, Nexplanon What should I tell my health care provider before I take this medicine? They need to know if you have any of these conditions: -abnormal vaginal bleeding -blood vessel disease or blood clots -cancer of the breast, cervix, or liver -depression -diabetes -gallbladder disease -headaches -heart disease or recent heart attack -high blood pressure -high cholesterol -kidney disease -liver disease -renal disease -seizures -tobacco smoker -an unusual or allergic reaction to etonogestrel, other hormones, anesthetics or antiseptics, medicines, foods, dyes, or preservatives -pregnant or trying to get pregnant -breast-feeding How should I use this medicine? This device is inserted just under the skin on the inner side of your upper arm by a health care professional. Talk to your pediatrician regarding the use of this medicine in children. Special care may be needed. Overdosage: If you think you've taken too much of this medicine contact a poison control center or emergency room at once. Overdosage: If you think you have taken too much of this medicine contact a poison control center or emergency room at once. NOTE: This medicine is only for you. Do not share this medicine with others. What if I miss a dose? This does not apply. What may interact with this medicine? Do not take this medicine with any of the following medications: -amprenavir -bosentan -fosamprenavir This medicine may also interact with the following medications: -barbiturate medicines for inducing sleep or treating seizures -certain medicines for fungal infections like ketoconazole and  itraconazole -griseofulvin -medicines to treat seizures like carbamazepine, felbamate, oxcarbazepine, phenytoin, topiramate -modafinil -phenylbutazone -rifampin -some medicines to treat HIV infection like atazanavir, indinavir, lopinavir, nelfinavir, tipranavir, ritonavir -St. John's wort This list may not describe all possible interactions. Give your health care provider a list of all the medicines, herbs, non-prescription drugs, or dietary supplements you use. Also tell them if you smoke, drink alcohol, or use illegal drugs. Some items may interact with your medicine. What should I watch for while using this medicine? This product does not protect you against HIV infection (AIDS) or other sexually transmitted diseases. You should be able to feel the implant by pressing your fingertips over the skin where it was inserted. Tell your doctor if you cannot feel the implant. What side effects may I notice from receiving this medicine? Side effects that you should report to your doctor or health care professional as soon as possible: -allergic reactions like skin rash, itching or hives, swelling of the face, lips, or tongue -breast lumps -changes in vision -confusion, trouble speaking or understanding -dark urine -depressed mood -general ill feeling or flu-like symptoms -light-colored stools -loss of appetite, nausea -right upper belly pain -severe headaches -severe pain, swelling, or tenderness in the abdomen -shortness of breath, chest pain, swelling in a leg -signs of pregnancy -sudden numbness or weakness of the face, arm or leg -trouble walking, dizziness, loss of balance or coordination -unusual vaginal bleeding, discharge -unusually weak or tired -yellowing of the eyes or skin Side effects that usually do not require medical attention (Report these to your doctor or health care professional if they continue or are bothersome.): -acne -breast pain -changes in  weight -cough -fever or chills -headache -irregular menstrual bleeding -itching, burning, and   vaginal discharge -pain or difficulty passing urine -sore throat This list may not describe all possible side effects. Call your doctor for medical advice about side effects. You may report side effects to FDA at 1-800-FDA-1088. Where should I keep my medicine? This drug is given in a hospital or clinic and will not be stored at home. NOTE: This sheet is a summary. It may not cover all possible information. If you have questions about this medicine, talk to your doctor, pharmacist, or health care provider.  2015, [employer] Standard. (2012-05-07 15:37:45)  

## 2015-05-28 NOTE — Progress Notes (Signed)
Subjective:  Jenna Duncan is a 34 y.o. G3P2002 at 6439w6d being seen today for ongoing prenatal care.  Patient reports no complaints.  Contractions: Not present.  Vag. Bleeding: None. Movement: Present. Denies leaking of fluid.   The following portions of the patient's history were reviewed and updated as appropriate: allergies, current medications, past family history, past medical history, past social history, past surgical history and problem list.   Objective:   Filed Vitals:   05/28/15 1450  BP: 88/62  Pulse: 85  Temp: 98.3 F (36.8 C)  Weight: 159 lb (72.122 kg)    Fetal Status: Fetal Heart Rate (bpm): 165   Movement: Present     General:  Alert, oriented and cooperative. Patient is in no acute distress.  Skin: Skin is warm and dry. No rash noted.   Cardiovascular: Normal heart rate noted  Respiratory: Normal respiratory effort, no problems with respiration noted  Abdomen: Soft, gravid, appropriate for gestational age. Pain/Pressure: Absent     Vaginal: Vag. Bleeding: None.       Cervix: Not evaluated        Extremities: Normal range of motion.  Edema: Trace  Mental Status: Normal mood and affect. Normal behavior. Normal judgment and thought content.   Urinalysis:    pending  Assessment and Plan:  Pregnancy: G3P2002 at 7939w6d Doing well  Preterm labor symptoms and general obstetric precautions including but not limited to vaginal bleeding, contractions, leaking of fluid and fetal movement were reviewed in detail with the patient. Please refer to After Visit Summary for other counseling recommendations.  Return in about 2 weeks (around 06/11/2015).   Danae Orleanseirdre C Rett Stehlik, CNM

## 2015-06-12 ENCOUNTER — Ambulatory Visit (INDEPENDENT_AMBULATORY_CARE_PROVIDER_SITE_OTHER): Payer: BLUE CROSS/BLUE SHIELD | Admitting: Family

## 2015-06-12 VITALS — BP 82/57 | HR 86 | Temp 99.0°F | Wt 158.8 lb

## 2015-06-12 DIAGNOSIS — R8271 Bacteriuria: Secondary | ICD-10-CM

## 2015-06-12 DIAGNOSIS — Z3493 Encounter for supervision of normal pregnancy, unspecified, third trimester: Secondary | ICD-10-CM

## 2015-06-12 DIAGNOSIS — N390 Urinary tract infection, site not specified: Secondary | ICD-10-CM

## 2015-06-12 LAB — POCT URINALYSIS DIP (DEVICE)
BILIRUBIN URINE: NEGATIVE
GLUCOSE, UA: NEGATIVE mg/dL
KETONES UR: NEGATIVE mg/dL
Nitrite: NEGATIVE
PROTEIN: 100 mg/dL — AB
Specific Gravity, Urine: 1.025 (ref 1.005–1.030)
Urobilinogen, UA: 0.2 mg/dL (ref 0.0–1.0)
pH: 7 (ref 5.0–8.0)

## 2015-06-12 LAB — OB RESULTS CONSOLE GC/CHLAMYDIA
CHLAMYDIA, DNA PROBE: NEGATIVE
Gonorrhea: NEGATIVE

## 2015-06-12 LAB — OB RESULTS CONSOLE GBS: STREP GROUP B AG: POSITIVE

## 2015-06-12 MED ORDER — NITROFURANTOIN MONOHYD MACRO 100 MG PO CAPS: 100.0000 mg | ORAL_CAPSULE | Freq: Two times a day (BID) | ORAL | Status: DC

## 2015-06-12 NOTE — Progress Notes (Signed)
Cultures today Breastfeeding tip of the week reviewed Hgb: trace, Leukocytes: large

## 2015-06-12 NOTE — Progress Notes (Signed)
Subjective:  Jenna Duncan is a 34 y.o. G3P2002 at [redacted]w[redacted]d being seen today for ongoing prenatal care.  Patient reports no complaints and pt denied UTI symptoms (large leuks in urine).  Contractions: Not present.  Vag. Bleeding: None. Movement: Present. Denies leaking of fluid.   The following portions of the patient's history were reviewed and updated as appropriate: allergies, current medications, past family history, past medical history, past social history, past surgical history and problem list.   Objective:   Filed Vitals:   06/12/15 1027  BP: 82/57  Pulse: 86  Temp: 99 F (37.2 C)  Weight: 158 lb 12.8 oz (72.031 kg)    Fetal Status: Fetal Heart Rate (bpm): 164 Fundal Height: 38 cm Movement: Present  Presentation: Vertex  General:  Alert, oriented and cooperative. Patient is in no acute distress.  Skin: Skin is warm and dry. No rash noted.   Cardiovascular: Normal heart rate noted  Respiratory: Normal respiratory effort, no problems with respiration noted  Abdomen: Soft, gravid, appropriate for gestational age. Pain/Pressure: Absent     Vaginal: Vag. Bleeding: None.       Cervix: Not evaluated        Extremities: Normal range of motion.  Edema: Trace  Mental Status: Normal mood and affect. Normal behavior. Normal judgment and thought content.   Urinalysis: Urine Protein: 2+ Urine Glucose: Negative  Assessment and Plan:  Pregnancy: G3P2002 at [redacted]w[redacted]d  1. Prenatal care in third trimester - GC/Chlamydia Probe Amp - GBS not collected, positive in urine  2. Bacteria in urine - Culture, OB Urine - nitrofurantoin, macrocrystal-monohydrate, (MACROBID) 100 MG capsule; Take 1 capsule (100 mg total) by mouth 2 (two) times daily.  Dispense: 14 capsule; Refill: 1  Term labor symptoms and general obstetric precautions including but not limited to vaginal bleeding, contractions, leaking of fluid and fetal movement were reviewed in detail with the patient. Please refer to After Visit Summary  for other counseling recommendations.  Return in about 1 week (around 06/19/2015).   Eino Farber Kennith Gain, CNM

## 2015-06-13 LAB — CULTURE, OB URINE

## 2015-06-13 LAB — GC/CHLAMYDIA PROBE AMP
CT Probe RNA: NEGATIVE
GC PROBE AMP APTIMA: NEGATIVE

## 2015-06-15 ENCOUNTER — Other Ambulatory Visit: Payer: Self-pay | Admitting: Family

## 2015-06-16 ENCOUNTER — Other Ambulatory Visit: Payer: Self-pay | Admitting: Family

## 2015-06-16 DIAGNOSIS — B951 Streptococcus, group B, as the cause of diseases classified elsewhere: Secondary | ICD-10-CM

## 2015-06-16 DIAGNOSIS — O2343 Unspecified infection of urinary tract in pregnancy, third trimester: Principal | ICD-10-CM

## 2015-06-16 MED ORDER — AMOXICILLIN 500 MG PO CAPS: 500.0000 mg | ORAL_CAPSULE | Freq: Three times a day (TID) | ORAL | Status: DC

## 2015-06-16 NOTE — Progress Notes (Signed)
Pt notified regarding +GBS in urine.  RX for amoxicillin called to pharmacy.

## 2015-06-18 ENCOUNTER — Ambulatory Visit (INDEPENDENT_AMBULATORY_CARE_PROVIDER_SITE_OTHER): Payer: BLUE CROSS/BLUE SHIELD | Admitting: Obstetrics & Gynecology

## 2015-06-18 VITALS — BP 113/67 | HR 102 | Wt 160.2 lb

## 2015-06-18 DIAGNOSIS — O2343 Unspecified infection of urinary tract in pregnancy, third trimester: Secondary | ICD-10-CM | POA: Diagnosis not present

## 2015-06-18 DIAGNOSIS — B951 Streptococcus, group B, as the cause of diseases classified elsewhere: Secondary | ICD-10-CM

## 2015-06-18 DIAGNOSIS — Z3483 Encounter for supervision of other normal pregnancy, third trimester: Secondary | ICD-10-CM

## 2015-06-18 LAB — POCT URINALYSIS DIP (DEVICE)
BILIRUBIN URINE: NEGATIVE
Glucose, UA: NEGATIVE mg/dL
Ketones, ur: NEGATIVE mg/dL
Leukocytes, UA: NEGATIVE
Nitrite: NEGATIVE
PROTEIN: NEGATIVE mg/dL
SPECIFIC GRAVITY, URINE: 1.02 (ref 1.005–1.030)
Urobilinogen, UA: 0.2 mg/dL (ref 0.0–1.0)
pH: 7 (ref 5.0–8.0)

## 2015-06-18 NOTE — Patient Instructions (Signed)
Return to clinic for any obstetric concerns or go to [facility] for evaluation  

## 2015-06-18 NOTE — Progress Notes (Signed)
Subjective:  Jenna Duncan is a 34 y.o. G3P2002 at [redacted]w[redacted]d being seen today for ongoing prenatal care.  Patient reports backache and occasional contractions.  Contractions: Not present.  Vag. Bleeding: None. Movement: Present. Denies leaking of fluid.   The following portions of the patient's history were reviewed and updated as appropriate: allergies, current medications, past family history, past medical history, past social history, past surgical history and problem list.   Objective:   Filed Vitals:   06/18/15 0801  BP: 113/67  Pulse: 102  Weight: 160 lb 3.2 oz (72.666 kg)    Fetal Status: Fetal Heart Rate (bpm): 138 Fundal Height: 39 cm Movement: Present  Presentation: Vertex  General:  Alert, oriented and cooperative. Patient is in no acute distress.  Skin: Skin is warm and dry. No rash noted.   Cardiovascular: Normal heart rate noted  Respiratory: Normal respiratory effort, no problems with respiration noted  Abdomen: Soft, gravid, appropriate for gestational age. Pain/Pressure: Present     Vaginal: Vag. Bleeding: None.       Cervix: Exam revealed Dilation: Fingertip Effacement (%): Thick Station: Ballotable  Extremities: Normal range of motion.  Edema: Trace  Mental Status: Normal mood and affect. Normal behavior. Normal judgment and thought content.   Urinalysis: Urine Protein: Negative Urine Glucose: Negative  Assessment and Plan:  Pregnancy: G3P2002 at [redacted]w[redacted]d  1. GBS (group B streptococcus) UTI complicating pregnancy, third trimester 06/12/15 urine culture also showed 60K GBS, treated with Amoxicillin.  Will also treat again in labor  2. Encounter for supervision of other normal pregnancy in third trimester Term labor symptoms and general obstetric precautions including but not limited to vaginal bleeding, contractions, leaking of fluid and fetal movement were reviewed in detail with the patient. Please refer to After Visit Summary for other counseling recommendations.  Return  in about 1 week (around 06/25/2015) for OB Visit.   Tereso Newcomer, MD

## 2015-06-25 ENCOUNTER — Ambulatory Visit (INDEPENDENT_AMBULATORY_CARE_PROVIDER_SITE_OTHER): Payer: BLUE CROSS/BLUE SHIELD | Admitting: Family Medicine

## 2015-06-25 VITALS — BP 103/58 | HR 83 | Temp 98.3°F | Wt 162.5 lb

## 2015-06-25 DIAGNOSIS — Z3483 Encounter for supervision of other normal pregnancy, third trimester: Secondary | ICD-10-CM | POA: Diagnosis not present

## 2015-06-25 LAB — POCT URINALYSIS DIP (DEVICE)
Bilirubin Urine: NEGATIVE
Glucose, UA: NEGATIVE mg/dL
Ketones, ur: NEGATIVE mg/dL
NITRITE: NEGATIVE
PROTEIN: NEGATIVE mg/dL
SPECIFIC GRAVITY, URINE: 1.015 (ref 1.005–1.030)
UROBILINOGEN UA: 0.2 mg/dL (ref 0.0–1.0)
pH: 7 (ref 5.0–8.0)

## 2015-06-25 NOTE — Patient Instructions (Signed)
Breastfeeding Deciding to breastfeed is one of the best choices you can make for you and your baby. A change in hormones during pregnancy causes your breast tissue to grow and increases the number and size of your milk ducts. These hormones also allow proteins, sugars, and fats from your blood supply to make breast milk in your milk-producing glands. Hormones prevent breast milk from being released before your baby is born as well as prompt milk flow after birth. Once breastfeeding has begun, thoughts of your baby, as well as his or her sucking or crying, can stimulate the release of milk from your milk-producing glands.  BENEFITS OF BREASTFEEDING For Your Baby  Your first milk (colostrum) helps your baby's digestive system function better.   There are antibodies in your milk that help your baby fight off infections.   Your baby has a lower incidence of asthma, allergies, and sudden infant death syndrome.   The nutrients in breast milk are better for your baby than infant formulas and are designed uniquely for your baby's needs.   Breast milk improves your baby's brain development.   Your baby is less likely to develop other conditions, such as childhood obesity, asthma, or type 2 diabetes mellitus.  For You   Breastfeeding helps to create a very special bond between you and your baby.   Breastfeeding is convenient. Breast milk is always available at the correct temperature and costs nothing.   Breastfeeding helps to burn calories and helps you lose the weight gained during pregnancy.   Breastfeeding makes your uterus contract to its prepregnancy size faster and slows bleeding (lochia) after you give birth.   Breastfeeding helps to lower your risk of developing type 2 diabetes mellitus, osteoporosis, and breast or ovarian cancer later in life. SIGNS THAT YOUR BABY IS HUNGRY Early Signs of Hunger  Increased alertness or activity.  Stretching.  Movement of the head from  side to side.  Movement of the head and opening of the mouth when the corner of the mouth or cheek is stroked (rooting).  Increased sucking sounds, smacking lips, cooing, sighing, or squeaking.  Hand-to-mouth movements.  Increased sucking of fingers or hands. Late Signs of Hunger  Fussing.  Intermittent crying. Extreme Signs of Hunger Signs of extreme hunger will require calming and consoling before your baby will be able to breastfeed successfully. Do not wait for the following signs of extreme hunger to occur before you initiate breastfeeding:   Restlessness.  A loud, strong cry.   Screaming. BREASTFEEDING BASICS Breastfeeding Initiation  Find a comfortable place to sit or lie down, with your neck and back well supported.  Place a pillow or rolled up blanket under your baby to bring him or her to the level of your breast (if you are seated). Nursing pillows are specially designed to help support your arms and your baby while you breastfeed.  Make sure that your baby's abdomen is facing your abdomen.   Gently massage your breast. With your fingertips, massage from your chest wall toward your nipple in a circular motion. This encourages milk flow. You may need to continue this action during the feeding if your milk flows slowly.  Support your breast with 4 fingers underneath and your thumb above your nipple. Make sure your fingers are well away from your nipple and your baby's mouth.   Stroke your baby's lips gently with your finger or nipple.   When your baby's mouth is open wide enough, quickly bring your baby to your   breast, placing your entire nipple and as much of the colored area around your nipple (areola) as possible into your baby's mouth.   More areola should be visible above your baby's upper lip than below the lower lip.   Your baby's tongue should be between his or her lower gum and your breast.   Ensure that your baby's mouth is correctly positioned  around your nipple (latched). Your baby's lips should create a seal on your breast and be turned out (everted).  It is common for your baby to suck about 2-3 minutes in order to start the flow of breast milk. Latching Teaching your baby how to latch on to your breast properly is very important. An improper latch can cause nipple pain and decreased milk supply for you and poor weight gain in your baby. Also, if your baby is not latched onto your nipple properly, he or she may swallow some air during feeding. This can make your baby fussy. Burping your baby when you switch breasts during the feeding can help to get rid of the air. However, teaching your baby to latch on properly is still the best way to prevent fussiness from swallowing air while breastfeeding. Signs that your baby has successfully latched on to your nipple:    Silent tugging or silent sucking, without causing you pain.   Swallowing heard between every 3-4 sucks.    Muscle movement above and in front of his or her ears while sucking.  Signs that your baby has not successfully latched on to nipple:   Sucking sounds or smacking sounds from your baby while breastfeeding.  Nipple pain. If you think your baby has not latched on correctly, slip your finger into the corner of your baby's mouth to break the suction and place it between your baby's gums. Attempt breastfeeding initiation again. Signs of Successful Breastfeeding Signs from your baby:   A gradual decrease in the number of sucks or complete cessation of sucking.   Falling asleep.   Relaxation of his or her body.   Retention of a small amount of milk in his or her mouth.   Letting go of your breast by himself or herself. Signs from you:  Breasts that have increased in firmness, weight, and size 1-3 hours after feeding.   Breasts that are softer immediately after breastfeeding.  Increased milk volume, as well as a change in milk consistency and color by  the fifth day of breastfeeding.   Nipples that are not sore, cracked, or bleeding. Signs That Your Baby is Getting Enough Milk  Wetting at least 3 diapers in a 24-hour period. The urine should be clear and pale yellow by age 5 days.  At least 3 stools in a 24-hour period by age 5 days. The stool should be soft and yellow.  At least 3 stools in a 24-hour period by age 7 days. The stool should be seedy and yellow.  No loss of weight greater than 10% of birth weight during the first 3 days of age.  Average weight gain of 4-7 ounces (113-198 g) per week after age 4 days.  Consistent daily weight gain by age 5 days, without weight loss after the age of 2 weeks. After a feeding, your baby may spit up a small amount. This is common. BREASTFEEDING FREQUENCY AND DURATION Frequent feeding will help you make more milk and can prevent sore nipples and breast engorgement. Breastfeed when you feel the need to reduce the fullness of your breasts   or when your baby shows signs of hunger. This is called "breastfeeding on demand." Avoid introducing a pacifier to your baby while you are working to establish breastfeeding (the first 4-6 weeks after your baby is born). After this time you may choose to use a pacifier. Research has shown that pacifier use during the first year of a baby's life decreases the risk of sudden infant death syndrome (SIDS). Allow your baby to feed on each breast as long as he or she wants. Breastfeed until your baby is finished feeding. When your baby unlatches or falls asleep while feeding from the first breast, offer the second breast. Because newborns are often sleepy in the first few weeks of life, you may need to awaken your baby to get him or her to feed. Breastfeeding times will vary from baby to baby. However, the following rules can serve as a guide to help you ensure that your baby is properly fed:  Newborns (babies 4 weeks of age or younger) may breastfeed every 1-3  hours.  Newborns should not go longer than 3 hours during the day or 5 hours during the night without breastfeeding.  You should breastfeed your baby a minimum of 8 times in a 24-hour period until you begin to introduce solid foods to your baby at around 6 months of age. BREAST MILK PUMPING Pumping and storing breast milk allows you to ensure that your baby is exclusively fed your breast milk, even at times when you are unable to breastfeed. This is especially important if you are going back to work while you are still breastfeeding or when you are not able to be present during feedings. Your lactation consultant can give you guidelines on how long it is safe to store breast milk.  A breast pump is a machine that allows you to pump milk from your breast into a sterile bottle. The pumped breast milk can then be stored in a refrigerator or freezer. Some breast pumps are operated by hand, while others use electricity. Ask your lactation consultant which type will work best for you. Breast pumps can be purchased, but some hospitals and breastfeeding support groups lease breast pumps on a monthly basis. A lactation consultant can teach you how to hand express breast milk, if you prefer not to use a pump.  CARING FOR YOUR BREASTS WHILE YOU BREASTFEED Nipples can become dry, cracked, and sore while breastfeeding. The following recommendations can help keep your breasts moisturized and healthy:  Avoid using soap on your nipples.   Wear a supportive bra. Although not required, special nursing bras and tank tops are designed to allow access to your breasts for breastfeeding without taking off your entire bra or top. Avoid wearing underwire-style bras or extremely tight bras.  Air dry your nipples for 3-4minutes after each feeding.   Use only cotton bra pads to absorb leaked breast milk. Leaking of breast milk between feedings is normal.   Use lanolin on your nipples after breastfeeding. Lanolin helps to  maintain your skin's normal moisture barrier. If you use pure lanolin, you do not need to wash it off before feeding your baby again. Pure lanolin is not toxic to your baby. You may also hand express a few drops of breast milk and gently massage that milk into your nipples and allow the milk to air dry. In the first few weeks after giving birth, some women experience extremely full breasts (engorgement). Engorgement can make your breasts feel heavy, warm, and tender to the   touch. Engorgement peaks within 3-5 days after you give birth. The following recommendations can help ease engorgement:  Completely empty your breasts while breastfeeding or pumping. You may want to start by applying warm, moist heat (in the shower or with warm water-soaked hand towels) just before feeding or pumping. This increases circulation and helps the milk flow. If your baby does not completely empty your breasts while breastfeeding, pump any extra milk after he or she is finished.  Wear a snug bra (nursing or regular) or tank top for 1-2 days to signal your body to slightly decrease milk production.  Apply ice packs to your breasts, unless this is too uncomfortable for you.  Make sure that your baby is latched on and positioned properly while breastfeeding. If engorgement persists after 48 hours of following these recommendations, contact your health care provider or a Advertising copywriterlactation consultant. OVERALL HEALTH CARE RECOMMENDATIONS WHILE BREASTFEEDING  Eat healthy foods. Alternate between meals and snacks, eating 3 of each per day. Because what you eat affects your breast milk, some of the foods may make your baby more irritable than usual. Avoid eating these foods if you are sure that they are negatively affecting your baby.  Drink milk, fruit juice, and water to satisfy your thirst (about 10 glasses a day).   Rest often, relax, and continue to take your prenatal vitamins to prevent fatigue, stress, and anemia.  Continue  breast self-awareness checks.  Avoid chewing and smoking tobacco.  Avoid alcohol and drug use. Some medicines that may be harmful to your baby can pass through breast milk. It is important to ask your health care provider before taking any medicine, including all over-the-counter and prescription medicine as well as vitamin and herbal supplements. It is possible to become pregnant while breastfeeding. If birth control is desired, ask your health care provider about options that will be safe for your baby. SEEK MEDICAL CARE IF:   You feel like you want to stop breastfeeding or have become frustrated with breastfeeding.  You have painful breasts or nipples.  Your nipples are cracked or bleeding.  Your breasts are red, tender, or warm.  You have a swollen area on either breast.  You have a fever or chills.  You have nausea or vomiting.  You have drainage other than breast milk from your nipples.  Your breasts do not become full before feedings by the fifth day after you give birth.  You feel sad and depressed.  Your baby is too sleepy to eat well.  Your baby is having trouble sleeping.   Your baby is wetting less than 3 diapers in a 24-hour period.  Your baby has less than 3 stools in a 24-hour period.  Your baby's skin or the white part of his or her eyes becomes yellow.   Your baby is not gaining weight by 355 days of age. SEEK IMMEDIATE MEDICAL CARE IF:   Your baby is overly tired (lethargic) and does not want to wake up and feed.  Your baby develops an unexplained fever. Document Released: 10/31/2005 Document Revised: 11/05/2013 Document Reviewed: 04/24/2013 [facility] Patient Information 2015 [place], [place]. This information is not intended to replace advice given to you by your health care provider. Make sure you discuss any questions you have with your health care provider. Third Trimester of Pregnancy The third trimester is from week 29 through week 42, months 7  through 9. The third trimester is a time when the fetus is growing rapidly. At the end of  the ninth month, the fetus is about 20 inches in length and weighs 6-10 pounds.  BODY CHANGES Your body goes through many changes during pregnancy. The changes vary from woman to woman.   Your weight will continue to increase. You can expect to gain 25-35 pounds (11-16 kg) by the end of the pregnancy.  You may begin to get stretch marks on your hips, abdomen, and breasts.  You may urinate more often because the fetus is moving lower into your pelvis and pressing on your bladder.  You may develop or continue to have heartburn as a result of your pregnancy.  You may develop constipation because certain hormones are causing the muscles that push waste through your intestines to slow down.  You may develop hemorrhoids or swollen, bulging veins (varicose veins).  You may have pelvic pain because of the weight gain and pregnancy hormones relaxing your joints between the bones in your pelvis. Backaches may result from overexertion of the muscles supporting your posture.  You may have changes in your hair. These can include thickening of your hair, rapid growth, and changes in texture. Some women also have hair loss during or after pregnancy, or hair that feels dry or thin. Your hair will most likely return to normal after your baby is born.  Your breasts will continue to grow and be tender. A yellow discharge may leak from your breasts called colostrum.  Your belly button may stick out.  You may feel short of breath because of your expanding uterus.  You may notice the fetus "dropping," or moving lower in your abdomen.  You may have a bloody mucus discharge. This usually occurs a few days to a week before labor begins.  Your cervix becomes thin and soft (effaced) near your due date. WHAT TO EXPECT AT YOUR PRENATAL EXAMS  You will have prenatal exams every 2 weeks until week 36. Then, you will have  weekly prenatal exams. During a routine prenatal visit:  You will be weighed to make sure you and the fetus are growing normally.  Your blood pressure is taken.  Your abdomen will be measured to track your baby's growth.  The fetal heartbeat will be listened to.  Any test results from the previous visit will be discussed.  You may have a cervical check near your due date to see if you have effaced. At around 36 weeks, your caregiver will check your cervix. At the same time, your caregiver will also perform a test on the secretions of the vaginal tissue. This test is to determine if a type of bacteria, Group B streptococcus, is present. Your caregiver will explain this further. Your caregiver may ask you:  What your birth plan is.  How you are feeling.  If you are feeling the baby move.  If you have had any abnormal symptoms, such as leaking fluid, bleeding, severe headaches, or abdominal cramping.  If you have any questions. Other tests or screenings that may be performed during your third trimester include:  Blood tests that check for low iron levels (anemia).  Fetal testing to check the health, activity level, and growth of the fetus. Testing is done if you have certain medical conditions or if there are problems during the pregnancy. FALSE LABOR You may feel small, irregular contractions that eventually go away. These are called Braxton Hicks contractions, or false labor. Contractions may last for hours, days, or even weeks before true labor sets in. If contractions come at regular intervals, intensify, or  become painful, it is best to be seen by your caregiver.  SIGNS OF LABOR   Menstrual-like cramps.  Contractions that are 5 minutes apart or less.  Contractions that start on the top of the uterus and spread down to the lower abdomen and back.  A sense of increased pelvic pressure or back pain.  A watery or bloody mucus discharge that comes from the vagina. If you have  any of these signs before the 37th week of pregnancy, call your caregiver right away. You need to go to the hospital to get checked immediately. HOME CARE INSTRUCTIONS   Avoid all smoking, herbs, alcohol, and unprescribed drugs. These chemicals affect the formation and growth of the baby.  Follow your caregiver's instructions regarding medicine use. There are medicines that are either safe or unsafe to take during pregnancy.  Exercise only as directed by your caregiver. Experiencing uterine cramps is a good sign to stop exercising.  Continue to eat regular, healthy meals.  Wear a good support bra for breast tenderness.  Do not use hot tubs, steam rooms, or saunas.  Wear your seat belt at all times when driving.  Avoid raw meat, uncooked cheese, cat litter boxes, and soil used by cats. These carry germs that can cause birth defects in the baby.  Take your prenatal vitamins.  Try taking a stool softener (if your caregiver approves) if you develop constipation. Eat more high-fiber foods, such as fresh vegetables or fruit and whole grains. Drink plenty of fluids to keep your urine clear or pale yellow.  Take warm sitz baths to soothe any pain or discomfort caused by hemorrhoids. Use hemorrhoid cream if your caregiver approves.  If you develop varicose veins, wear support hose. Elevate your feet for 15 minutes, 3-4 times a day. Limit salt in your diet.  Avoid heavy lifting, wear low heal shoes, and practice good posture.  Rest a lot with your legs elevated if you have leg cramps or low back pain.  Visit your dentist if you have not gone during your pregnancy. Use a soft toothbrush to brush your teeth and be gentle when you floss.  A sexual relationship may be continued unless your caregiver directs you otherwise.  Do not travel far distances unless it is absolutely necessary and only with the approval of your caregiver.  Take prenatal classes to understand, practice, and ask questions  about the labor and delivery.  Make a trial run to the hospital.  Pack your hospital bag.  Prepare the baby's nursery.  Continue to go to all your prenatal visits as directed by your caregiver. SEEK MEDICAL CARE IF:  You are unsure if you are in labor or if your water has broken.  You have dizziness.  You have mild pelvic cramps, pelvic pressure, or nagging pain in your abdominal area.  You have persistent nausea, vomiting, or diarrhea.  You have a bad smelling vaginal discharge.  You have pain with urination. SEEK IMMEDIATE MEDICAL CARE IF:   You have a fever.  You are leaking fluid from your vagina.  You have spotting or bleeding from your vagina.  You have severe abdominal cramping or pain.  You have rapid weight loss or gain.  You have shortness of breath with chest pain.  You notice sudden or extreme swelling of your face, hands, ankles, feet, or legs.  You have not felt your baby move in over an hour.  You have severe headaches that do not go away with medicine.  You  Document Released: 10/25/2001 Document Revised: 11/05/2013 Document Reviewed: 01/01/2013 [employer] Patient Information 2015 [employer]. This information is not intended to replace advice given to you by your health care provider. Make sure you discuss any questions you have with your health care provider.     

## 2015-06-25 NOTE — Progress Notes (Signed)
Subjective:  Jenna Duncan is a 34 y.o. G3P2002 at [redacted]w[redacted]d being seen today for ongoing prenatal care.  Patient reports no complaints.  Contractions: Not present.  Vag. Bleeding: None. Movement: Present. Denies leaking of fluid.   The following portions of the patient's history were reviewed and updated as appropriate: allergies, current medications, past family history, past medical history, past social history, past surgical history and problem list.   Objective:   Filed Vitals:   06/25/15 1023  BP: 103/58  Pulse: 83  Temp: 98.3 F (36.8 C)  Weight: 162 lb 8 oz (73.71 kg)    Fetal Status: Fetal Heart Rate (bpm): 153   Movement: Present     General:  Alert, oriented and cooperative. Patient is in no acute distress.  Skin: Skin is warm and dry. No rash noted.   Cardiovascular: Normal heart rate noted  Respiratory: Normal respiratory effort, no problems with respiration noted  Abdomen: Soft, gravid, appropriate for gestational age. Pain/Pressure: Present     Pelvic: Vag. Bleeding: None     Cervical exam deferred        Extremities: Normal range of motion.  Edema: Trace  Mental Status: Normal mood and affect. Normal behavior. Normal judgment and thought content.   Urinalysis: Urine Protein: Negative Urine Glucose: Negative  Assessment and Plan:  Pregnancy: G3P2002 at [redacted]w[redacted]d  1. Encounter for supervision of other normal pregnancy in third trimester Updated overview Reviewed labor precautions Reviewed recommendations for IOl after 41 weeks  Term labor symptoms and general obstetric precautions including but not limited to vaginal bleeding, contractions, leaking of fluid and fetal movement were reviewed in detail with the patient. Please refer to After Visit Summary for other counseling recommendations.  Return in about 1 week (around 07/02/2015) for routine PNC.   Federico Flake, MD

## 2015-06-25 NOTE — Progress Notes (Signed)
Pain- lower back   Pressure- lower abd

## 2015-07-02 ENCOUNTER — Telehealth (HOSPITAL_COMMUNITY): Payer: Self-pay | Admitting: *Deleted

## 2015-07-02 ENCOUNTER — Ambulatory Visit (INDEPENDENT_AMBULATORY_CARE_PROVIDER_SITE_OTHER): Payer: BLUE CROSS/BLUE SHIELD | Admitting: Family Medicine

## 2015-07-02 VITALS — BP 94/48 | HR 80 | Temp 98.5°F | Wt 162.4 lb

## 2015-07-02 DIAGNOSIS — B951 Streptococcus, group B, as the cause of diseases classified elsewhere: Secondary | ICD-10-CM

## 2015-07-02 DIAGNOSIS — O2343 Unspecified infection of urinary tract in pregnancy, third trimester: Secondary | ICD-10-CM

## 2015-07-02 DIAGNOSIS — Z3483 Encounter for supervision of other normal pregnancy, third trimester: Secondary | ICD-10-CM

## 2015-07-02 LAB — POCT URINALYSIS DIP (DEVICE)
Bilirubin Urine: NEGATIVE
Glucose, UA: NEGATIVE mg/dL
KETONES UR: NEGATIVE mg/dL
Nitrite: NEGATIVE
PH: 7.5 (ref 5.0–8.0)
PROTEIN: NEGATIVE mg/dL
SPECIFIC GRAVITY, URINE: 1.02 (ref 1.005–1.030)
UROBILINOGEN UA: 0.2 mg/dL (ref 0.0–1.0)

## 2015-07-02 NOTE — Progress Notes (Signed)
IOL scheduled for 07/10/2015 :30AM

## 2015-07-02 NOTE — Telephone Encounter (Signed)
Preadmission screen Interpreter number [phone]

## 2015-07-02 NOTE — Progress Notes (Signed)
Subjective:  Jenna Duncan is a 34 y.o. G3P2002 at [redacted]w[redacted]d being seen today for ongoing prenatal care.  Patient reports backache.  Contractions: Not present.  Vag. Bleeding: None. Movement: Present. Denies leaking of fluid.   The following portions of the patient's history were reviewed and updated as appropriate: allergies, current medications, past family history, past medical history, past social history, past surgical history and problem list.   Objective:   Filed Vitals:   07/02/15 1030  BP: 94/48  Pulse: 80  Temp: 98.5 F (36.9 C)  Weight: 162 lb 6.4 oz (73.664 kg)    Fetal Status: Fetal Heart Rate (bpm): 140   Movement: Present     General:  Alert, oriented and cooperative. Patient is in no acute distress.  Skin: Skin is warm and dry. No rash noted.   Cardiovascular: Normal heart rate noted  Respiratory: Normal respiratory effort, no problems with respiration noted  Abdomen: Soft, gravid, appropriate for gestational age. Pain/Pressure: Present     Pelvic: Vag. Bleeding: None     Cervical exam performed Dilation: 1.5 Effacement (%): 50 Station: -3  Extremities: Normal range of motion.  Edema: None  Mental Status: Normal mood and affect. Normal behavior. Normal judgment and thought content.   Urinalysis: Urine Protein: Negative Urine Glucose: Negative  Assessment and Plan:  Pregnancy: G3P2002 at [redacted]w[redacted]d  1. Encounter for supervision of other normal pregnancy in third trimester Updated box Reviewed labor precautions Scheduled IOL   2. GBS (group B streptococcus) UTI complicating pregnancy, third trimester Needs PCN in labor  Term labor symptoms and general obstetric precautions including but not limited to vaginal bleeding, contractions, leaking of fluid and fetal movement were reviewed in detail with the patient. Please refer to After Visit Summary for other counseling recommendations.  Return in about 1 week (around 07/09/2015) for Routine prenatal care.   Federico Flake, MD

## 2015-07-02 NOTE — Progress Notes (Signed)
States has noticed a gradual decrease in fetal movement, but still feeling baby move well several times throughout the 24 hours. C/o lower back pain.

## 2015-07-05 ENCOUNTER — Inpatient Hospital Stay (EMERGENCY_DEPARTMENT_HOSPITAL)
Admission: AD | Admit: 2015-07-05 | Discharge: 2015-07-05 | Disposition: A | Discharge status: Home / Self Care | Payer: BLUE CROSS/BLUE SHIELD | Source: Ambulatory Visit | Attending: Obstetrics & Gynecology | Admitting: Obstetrics & Gynecology

## 2015-07-05 ENCOUNTER — Encounter (HOSPITAL_COMMUNITY): Payer: Self-pay

## 2015-07-05 DIAGNOSIS — O26853 Spotting complicating pregnancy, third trimester: Secondary | ICD-10-CM | POA: Diagnosis not present

## 2015-07-05 DIAGNOSIS — Z3A40 40 weeks gestation of pregnancy: Secondary | ICD-10-CM | POA: Diagnosis not present

## 2015-07-05 NOTE — MAU Provider Note (Signed)
  History    G3P2002 at 40.2 wks in with c/o vaginal bleeding when she wipes. Denies pain or ROM. CSN: [id number]  Arrival date and time: 07/05/15 1339   None     Chief Complaint  Patient presents with  . Vaginal Bleeding  . Back Pain   HPI  OB History    Gravida Para Term Preterm AB TAB SAB Ectopic Multiple Living   No past medical history on file.  Past Surgical History  Procedure Laterality Date  . No past surgeries      No family history on file.  Social History  Substance Use Topics  . Smoking status: Never Smoker   . Smokeless tobacco: None  . Alcohol Use: No    Allergies:  Allergies  Allergen Reactions  . Iron Swelling    Prescriptions prior to admission  Medication Sig Dispense Refill Last Dose  . Prenatal Multivit-Min-Fe-FA (PRENATAL VITAMINS) 0.8 MG tablet Take 1 tablet by mouth daily.   Taking    Review of Systems  Constitutional: Negative.   HENT: Negative.   Eyes: Negative.   Respiratory: Negative.   Cardiovascular: Negative.   Gastrointestinal: Negative.   Genitourinary: Negative.   Musculoskeletal: Negative.   Skin: Negative.   Neurological: Negative.   Endo/Heme/Allergies: Negative.   Psychiatric/Behavioral: Negative.    Physical Exam   Blood pressure 96/71, pulse 98, temperature 98 F (36.7 C), temperature source Oral, last menstrual period 09/26/2014, unknown if currently breastfeeding.  Physical Exam  Constitutional: She is oriented to person, place, and time. She appears well-developed and well-nourished.  HENT:  Head: Normocephalic.  Neck: Normal range of motion. Neck supple.  Cardiovascular: Normal rate, regular rhythm and normal heart sounds.   Respiratory: Effort normal and breath sounds normal. No respiratory distress.  GI: Soft. There is no tenderness.  Genitourinary: No bleeding in the vagina. Vaginal discharge found.  Scant amt brownish vag bleeding  Musculoskeletal: Normal range of motion. She  exhibits no edema.  Neurological: She is alert and oriented to person, place, and time.  Skin: Skin is warm and dry.  Psychiatric: She has a normal mood and affect. Her behavior is normal. Judgment and thought content normal.    MAU Course  Procedures  MDM Thinning of cervix  Assessment and Plan  SVE ft/70/-2 reassurring FHR Scant amt brownish bleeding on exam glove D/c home  LAWSON, MARIE DARLENE 07/05/2015, 2:15 PM

## 2015-07-05 NOTE — Discharge Instructions (Signed)
Fetal Movement Counts °Patient Name: __________________________________________________ Patient Due Date: ____________________ °Performing a fetal movement count is highly recommended in high-risk pregnancies, but it is good for every pregnant woman to do. Your health care provider may ask you to start counting fetal movements at 28 weeks of the pregnancy. Fetal movements often increase: °· After eating a full meal. °· After physical activity. °· After eating or drinking something sweet or cold. °· At rest. °Pay attention to when you feel the baby is most active. This will help you notice a pattern of your baby's sleep and wake cycles and what factors contribute to an increase in fetal movement. It is important to perform a fetal movement count at the same time each day when your baby is normally most active.  °HOW TO COUNT FETAL MOVEMENTS °1. Find a quiet and comfortable area to sit or lie down on your left side. Lying on your left side provides the best blood and oxygen circulation to your baby. °2. Write down the day and time on a sheet of paper or in a journal. °3. Start counting kicks, flutters, swishes, rolls, or jabs in a 2-hour period. You should feel at least 10 movements within 2 hours. °4. If you do not feel 10 movements in 2 hours, wait 2-3 hours and count again. Look for a change in the pattern or not enough counts in 2 hours. °SEEK MEDICAL CARE IF: °· You feel less than 10 counts in 2 hours, tried twice. °· There is no movement in over an hour. °· The pattern is changing or taking longer each day to reach 10 counts in 2 hours. °· You feel the baby is not moving as he or she usually does. °Date: ____________ Movements: ____________ Start time: ____________ Finish time: ____________  °Date: ____________ Movements: ____________ Start time: ____________ Finish time: ____________ °Date: ____________ Movements: ____________ Start time: ____________ Finish time: ____________ °Date: ____________ Movements:  ____________ Start time: ____________ Finish time: ____________ °Date: ____________ Movements: ____________ Start time: ____________ Finish time: ____________ °Date: ____________ Movements: ____________ Start time: ____________ Finish time: ____________ °Date: ____________ Movements: ____________ Start time: ____________ Finish time: ____________ °Date: ____________ Movements: ____________ Start time: ____________ Finish time: ____________  °Date: ____________ Movements: ____________ Start time: ____________ Finish time: ____________ °Date: ____________ Movements: ____________ Start time: ____________ Finish time: ____________ °Date: ____________ Movements: ____________ Start time: ____________ Finish time: ____________ °Date: ____________ Movements: ____________ Start time: ____________ Finish time: ____________ °Date: ____________ Movements: ____________ Start time: ____________ Finish time: ____________ °Date: ____________ Movements: ____________ Start time: ____________ Finish time: ____________ °Date: ____________ Movements: ____________ Start time: ____________ Finish time: ____________  °Date: ____________ Movements: ____________ Start time: ____________ Finish time: ____________ °Date: ____________ Movements: ____________ Start time: ____________ Finish time: ____________ °Date: ____________ Movements: ____________ Start time: ____________ Finish time: ____________ °Date: ____________ Movements: ____________ Start time: ____________ Finish time: ____________ °Date: ____________ Movements: ____________ Start time: ____________ Finish time: ____________ °Date: ____________ Movements: ____________ Start time: ____________ Finish time: ____________ °Date: ____________ Movements: ____________ Start time: ____________ Finish time: ____________  °Date: ____________ Movements: ____________ Start time: ____________ Finish time: ____________ °Date: ____________ Movements: ____________ Start time: ____________ Finish  time: ____________ °Date: ____________ Movements: ____________ Start time: ____________ Finish time: ____________ °Date: ____________ Movements: ____________ Start time: ____________ Finish time: ____________ °Date: ____________ Movements: ____________ Start time: ____________ Finish time: ____________ °Date: ____________ Movements: ____________ Start time: ____________ Finish time: ____________ °Date: ____________ Movements: ____________ Start time: ____________ Finish time: ____________  °Date: ____________ Movements: ____________ Start time: ____________ Finish   time: ____________ °Date: ____________ Movements: ____________ Start time: ____________ Finish time: ____________ °Date: ____________ Movements: ____________ Start time: ____________ Finish time: ____________ °Date: ____________ Movements: ____________ Start time: ____________ Finish time: ____________ °Date: ____________ Movements: ____________ Start time: ____________ Finish time: ____________ °Date: ____________ Movements: ____________ Start time: ____________ Finish time: ____________ °Date: ____________ Movements: ____________ Start time: ____________ Finish time: ____________  °Date: ____________ Movements: ____________ Start time: ____________ Finish time: ____________ °Date: ____________ Movements: ____________ Start time: ____________ Finish time: ____________ °Date: ____________ Movements: ____________ Start time: ____________ Finish time: ____________ °Date: ____________ Movements: ____________ Start time: ____________ Finish time: ____________ °Date: ____________ Movements: ____________ Start time: ____________ Finish time: ____________ °Date: ____________ Movements: ____________ Start time: ____________ Finish time: ____________ °Date: ____________ Movements: ____________ Start time: ____________ Finish time: ____________  °Date: ____________ Movements: ____________ Start time: ____________ Finish time: ____________ °Date: ____________  Movements: ____________ Start time: ____________ Finish time: ____________ °Date: ____________ Movements: ____________ Start time: ____________ Finish time: ____________ °Date: ____________ Movements: ____________ Start time: ____________ Finish time: ____________ °Date: ____________ Movements: ____________ Start time: ____________ Finish time: ____________ °Date: ____________ Movements: ____________ Start time: ____________ Finish time: ____________ °Date: ____________ Movements: ____________ Start time: ____________ Finish time: ____________  °Date: ____________ Movements: ____________ Start time: ____________ Finish time: ____________ °Date: ____________ Movements: ____________ Start time: ____________ Finish time: ____________ °Date: ____________ Movements: ____________ Start time: ____________ Finish time: ____________ °Date: ____________ Movements: ____________ Start time: ____________ Finish time: ____________ °Date: ____________ Movements: ____________ Start time: ____________ Finish time: ____________ °Date: ____________ Movements: ____________ Start time: ____________ Finish time: ____________ °Document Released: 11/30/2006 Document Revised: 03/17/2014 Document Reviewed: 08/27/2012 °[employer]® Patient Information ©2015 [employer]. This information is not intended to replace advice given to you by your health care provider. Make sure you discuss any questions you have with your health care provider. °Vaginal Delivery °During delivery, your health care provider will help you give birth to your baby. During a vaginal delivery, you will work to push the baby out of your vagina. However, before you can push your baby out, a few things need to happen. The opening of your uterus (cervix) has to soften, thin out, and open up (dilate) all the way to 10 cm. Also, your baby has to move down from the uterus into your vagina.  °SIGNS OF LABOR  °Your health care provider will first need to make sure you are in labor.  Signs of labor include:  °· Passing what is called the mucous plug before labor begins. This is a small amount of blood-stained mucus. °· Having regular, painful uterine contractions.   °· The time between contractions gets shorter.   °· The discomfort and pain gradually get more intense. °· Contraction pains get worse when walking and do not go away when resting.   °· Your cervix becomes thinner (effacement) and dilates. °BEFORE THE DELIVERY °Once you are in labor and admitted into the hospital or care center, your health care provider may do the following:  °5. Perform a complete physical exam. °6. Review any complications related to pregnancy or labor.  °7. Check your blood pressure, pulse, temperature, and heart rate (vital signs).   °8. Determine if, and when, the rupture of amniotic membranes occurred. °9. Do a vaginal exam (using a sterile glove and lubricant) to determine:   °1. The position (presentation) of the baby. Is the baby's head presenting first (vertex) in the birth canal (vagina), or are the feet or buttocks first (breech)?   °2. The level (station) of the baby's head within the birth canal.   °3.   The effacement and dilatation of the cervix.   °10. An electronic fetal monitor is usually placed on your abdomen when you first arrive. This is used to monitor your contractions and the baby's heart rate. °1. When the monitor is on your abdomen (external fetal monitor), it can only pick up the frequency and length of your contractions. It cannot tell the strength of your contractions. °2. If it becomes necessary for your health care provider to know exactly how strong your contractions are or to see exactly what the baby's heart rate is doing, an internal monitor may be inserted into your vagina and uterus. Your health care provider will discuss the benefits and risks of using an internal monitor and obtain your permission before inserting the device. °3. Continuous fetal monitoring may be needed if you  have an epidural, are receiving certain medicines (such as oxytocin), or have pregnancy or labor complications. °11. An IV access tube may be placed into a vein in your arm to deliver fluids and medicines if necessary. °THREE STAGES OF LABOR AND DELIVERY °Normal labor and delivery is divided into three stages. °First Stage °This stage starts when you begin to contract regularly and your cervix begins to efface and dilate. It ends when your cervix is completely open (fully dilated). The first stage is the longest stage of labor and can last from 3 hours to 15 hours.  °Several methods are available to help with labor pain. You and your health care provider will decide which option is best for you. Options include:  °· Opioid medicines. These are strong pain medicines that you can get through your IV tube or as a shot into your muscle. These medicines lessen pain but do not make it go away completely.  °· Epidural. A medicine is given through a thin tube that is inserted in your back. The medicine numbs the lower part of your body and prevents any pain in that area. °· Paracervical pain medicine. This is an injection of an anesthetic on each side of your cervix.   °· You may request natural childbirth, which does not involve the use of pain medicines or an epidural during labor and delivery. Instead, you will use other things, such as breathing exercises, to help cope with the pain. °Second Stage °The second stage of labor begins when your cervix is fully dilated at 10 cm. It continues until you push your baby down through the birth canal and the baby is born. This stage can take only minutes or several hours. °· The location of your baby's head as it moves through the birth canal is reported as a number called a station. If the baby's head has not started its descent, the station is described as being at minus 3 (-3). When your baby's head is at the zero station, it is at the middle of the birth canal and is engaged  in the pelvis. The station of your baby helps indicate the progress of the second stage of labor. °· When your baby is born, your health care provider may hold the baby with his or her head lowered to prevent amniotic fluid, mucus, and blood from getting into the baby's lungs. The baby's mouth and nose may be suctioned with a small bulb syringe to remove any additional fluid. °· Your health care provider may then place the baby on your stomach. It is important to keep the baby from getting cold. To do this, the health care provider will dry the baby off, place the   baby directly on your skin (with no blankets between you and the baby), and cover the baby with warm, dry blankets.   °· The umbilical cord is cut. °Third Stage °During the third stage of labor, your health care provider will deliver the placenta (afterbirth) and make sure your bleeding is under control. The delivery of the placenta usually takes about 5 minutes but can take up to 30 minutes. After the placenta is delivered, a medicine may be given either by IV or injection to help contract the uterus and control bleeding. If you are planning to breastfeed, you can try to do so now. °After you deliver the placenta, your uterus should contract and get very firm. If your uterus does not remain firm, your health care provider will massage it. This is important because the contraction of the uterus helps cut off bleeding at the site where the placenta was attached to your uterus. If your uterus does not contract properly and stay firm, you may continue to bleed heavily. If there is a lot of bleeding, medicines may be given to contract the uterus and stop the bleeding.  °Document Released: 08/09/2008 Document Revised: 03/17/2014 Document Reviewed: 04/21/2013 °Exit[employer]® Patient Information ©2015 [employer]. This information is not intended to replace advice given to you by your health care provider. Make sure you discuss any questions you have with your  health care provider. ° °

## 2015-07-05 NOTE — MAU Note (Signed)
Zerita Boers CNM to come to MAU to evaluate patient's bleeding.

## 2015-07-05 NOTE — MAU Note (Signed)
Vaginal bleeding on toilet tissue today around 12:00 pm with lower back pain

## 2015-07-06 ENCOUNTER — Telehealth: Payer: Self-pay | Admitting: *Deleted

## 2015-07-06 NOTE — Telephone Encounter (Signed)
A female called and left a message Sunday 07/05/15 at 1230pm stating he is calling for his wife. States she is [redacted] weeks pregnant and feeling blood, wants to know what to do.  Per chart patient came if for labor check with c/o blood - dark brown spotting noted on exam.  Called and spoke with patient's husband and Nathalee with him. Per chart she prefers to use family as interpreter and signed waiver.  He states she came to hospital [facility]au yesterday and they said everything is  fine. States her contractions were every 11 minutes , now every 9 minutes but states bleeding increased and is now red at time and brown sometimes.  She states baby is moving well.  I advised to bring her to mau to be checked. They voice understanding.

## 2015-07-07 ENCOUNTER — Inpatient Hospital Stay (HOSPITAL_COMMUNITY)
Admission: AD | Admit: 2015-07-07 | Discharge: 2015-07-08 | DRG: 775 | Disposition: A | Discharge status: Home / Self Care | Payer: BLUE CROSS/BLUE SHIELD | Source: Ambulatory Visit | Attending: Family Medicine | Admitting: Family Medicine

## 2015-07-07 ENCOUNTER — Encounter (HOSPITAL_COMMUNITY): Payer: Self-pay | Admitting: *Deleted

## 2015-07-07 ENCOUNTER — Inpatient Hospital Stay (HOSPITAL_COMMUNITY): Payer: BLUE CROSS/BLUE SHIELD | Admitting: Anesthesiology

## 2015-07-07 DIAGNOSIS — IMO0001 Reserved for inherently not codable concepts without codable children: Secondary | ICD-10-CM

## 2015-07-07 DIAGNOSIS — O99824 Streptococcus B carrier state complicating childbirth: Secondary | ICD-10-CM | POA: Diagnosis present

## 2015-07-07 DIAGNOSIS — Z3A40 40 weeks gestation of pregnancy: Secondary | ICD-10-CM | POA: Diagnosis present

## 2015-07-07 DIAGNOSIS — O9902 Anemia complicating childbirth: Secondary | ICD-10-CM | POA: Diagnosis present

## 2015-07-07 LAB — TYPE AND SCREEN
ABO/RH(D): O POS
Antibody Screen: NEGATIVE

## 2015-07-07 LAB — CBC
HCT: 34.8 % — ABNORMAL LOW (ref 36.0–46.0)
Hemoglobin: 11.6 g/dL — ABNORMAL LOW (ref 12.0–15.0)
MCH: 29.7 pg (ref 26.0–34.0)
MCHC: 33.3 g/dL (ref 30.0–36.0)
MCV: 89.2 fL (ref 78.0–100.0)
PLATELETS: 182 10*3/uL (ref 150–400)
RBC: 3.9 MIL/uL (ref 3.87–5.11)
RDW: 14.3 % (ref 11.5–15.5)
WBC: 11.1 10*3/uL — ABNORMAL HIGH (ref 4.0–10.5)

## 2015-07-07 LAB — RPR: RPR Ser Ql: NONREACTIVE

## 2015-07-07 LAB — ABO/RH: ABO/RH(D): O POS

## 2015-07-07 LAB — HIV ANTIBODY (ROUTINE TESTING W REFLEX): HIV Screen 4th Generation wRfx: NONREACTIVE

## 2015-07-07 MED ORDER — ACETAMINOPHEN 325 MG PO TABS
650.0000 mg | ORAL_TABLET | ORAL | Status: DC | PRN
  Administered 2015-07-07: 650 mg via ORAL

## 2015-07-07 MED ORDER — PENICILLIN G POTASSIUM 5000000 UNITS IJ SOLR
2.5000 10*6.[IU] | INTRAVENOUS | Status: DC
  Administered 2015-07-07 (×2): 2.5 10*6.[IU] via INTRAVENOUS
  Filled 2015-07-07 (×6): qty 2.5

## 2015-07-07 MED ORDER — OXYCODONE-ACETAMINOPHEN 5-325 MG PO TABS: 2.0000 | ORAL_TABLET | ORAL | Status: DC | PRN

## 2015-07-07 MED ORDER — PRENATAL MULTIVITAMIN CH: 1.0000 | ORAL_TABLET | Freq: Every day | ORAL | Status: DC

## 2015-07-07 MED ORDER — LIDOCAINE HCL (PF) 1 % IJ SOLN
INTRAMUSCULAR | Status: DC | PRN
  Administered 2015-07-07 (×2): 4 mL via EPIDURAL

## 2015-07-07 MED ORDER — OXYTOCIN 40 UNITS IN LACTATED RINGERS INFUSION - SIMPLE MED
1.0000 m[IU]/min | INTRAVENOUS | Status: DC
  Administered 2015-07-07: 2 m[IU]/min via INTRAVENOUS

## 2015-07-07 MED ORDER — ONDANSETRON HCL 4 MG/2ML IJ SOLN: 4.0000 mg | INTRAMUSCULAR | Status: DC | PRN

## 2015-07-07 MED ORDER — FENTANYL 2.5 MCG/ML BUPIVACAINE 1/10 % EPIDURAL INFUSION (WH - ANES)
14.0000 mL/h | INTRAMUSCULAR | Status: DC | PRN
  Administered 2015-07-07: 14 mL/h via EPIDURAL
  Administered 2015-07-07: 13 mL/h via EPIDURAL
  Filled 2015-07-07 (×2): qty 125

## 2015-07-07 MED ORDER — TERBUTALINE SULFATE 1 MG/ML IJ SOLN
0.2500 mg | Freq: Once | INTRAMUSCULAR | Status: DC | PRN
Start: 2015-07-07 — End: 2015-07-07
  Filled 2015-07-07: qty 1

## 2015-07-07 MED ORDER — PHENYLEPHRINE 40 MCG/ML (10ML) SYRINGE FOR IV PUSH (FOR BLOOD PRESSURE SUPPORT)
80.0000 ug | PREFILLED_SYRINGE | INTRAVENOUS | Status: DC | PRN
  Filled 2015-07-07: qty 20
  Filled 2015-07-07: qty 2

## 2015-07-07 MED ORDER — CITRIC ACID-SODIUM CITRATE 334-500 MG/5ML PO SOLN: 30.0000 mL | ORAL | Status: DC | PRN

## 2015-07-07 MED ORDER — FENTANYL 2.5 MCG/ML BUPIVACAINE 1/10 % EPIDURAL INFUSION (WH - ANES)
13.0000 mL/h | INTRAMUSCULAR | Status: DC | PRN
Start: 2015-07-07 — End: 2015-07-07

## 2015-07-07 MED ORDER — ACETAMINOPHEN 325 MG PO TABS
650.0000 mg | ORAL_TABLET | ORAL | Status: DC | PRN
  Administered 2015-07-08 (×4): 650 mg via ORAL
  Filled 2015-07-07 (×5): qty 2

## 2015-07-07 MED ORDER — OXYTOCIN 40 UNITS IN LACTATED RINGERS INFUSION - SIMPLE MED
62.5000 mL/h | INTRAVENOUS | Status: DC
  Filled 2015-07-07: qty 1000

## 2015-07-07 MED ORDER — DIPHENHYDRAMINE HCL 25 MG PO CAPS: 25.0000 mg | ORAL_CAPSULE | Freq: Four times a day (QID) | ORAL | Status: DC | PRN

## 2015-07-07 MED ORDER — PENICILLIN G POTASSIUM 5000000 UNITS IJ SOLR
2.5000 10*6.[IU] | INTRAMUSCULAR | Status: DC
  Filled 2015-07-07 (×4): qty 2.5

## 2015-07-07 MED ORDER — LACTATED RINGERS IV SOLN
INTRAVENOUS | Status: DC
  Administered 2015-07-07 (×2): via INTRAVENOUS

## 2015-07-07 MED ORDER — PENICILLIN G POTASSIUM 5000000 UNITS IJ SOLR
5.0000 10*6.[IU] | Freq: Once | INTRAVENOUS | Status: AC
  Administered 2015-07-07: 5 10*6.[IU] via INTRAVENOUS
  Filled 2015-07-07: qty 5

## 2015-07-07 MED ORDER — SIMETHICONE 80 MG PO CHEW: 80.0000 mg | CHEWABLE_TABLET | ORAL | Status: DC | PRN

## 2015-07-07 MED ORDER — OXYCODONE-ACETAMINOPHEN 5-325 MG PO TABS: 1.0000 | ORAL_TABLET | ORAL | Status: DC | PRN

## 2015-07-07 MED ORDER — BENZOCAINE-MENTHOL 20-0.5 % EX AERO
1.0000 "application " | INHALATION_SPRAY | CUTANEOUS | Status: DC | PRN
  Administered 2015-07-07: 1 via TOPICAL
  Filled 2015-07-07: qty 56

## 2015-07-07 MED ORDER — LANOLIN HYDROUS EX OINT: TOPICAL_OINTMENT | CUTANEOUS | Status: DC | PRN

## 2015-07-07 MED ORDER — SENNOSIDES-DOCUSATE SODIUM 8.6-50 MG PO TABS
2.0000 | ORAL_TABLET | ORAL | Status: DC
  Administered 2015-07-08: 2 via ORAL
  Filled 2015-07-07: qty 2

## 2015-07-07 MED ORDER — TETANUS-DIPHTH-ACELL PERTUSSIS 5-2.5-18.5 LF-MCG/0.5 IM SUSP: 0.5000 mL | Freq: Once | INTRAMUSCULAR | Status: DC

## 2015-07-07 MED ORDER — LIDOCAINE HCL (PF) 1 % IJ SOLN
30.0000 mL | INTRAMUSCULAR | Status: DC | PRN
  Filled 2015-07-07: qty 30

## 2015-07-07 MED ORDER — EPHEDRINE 5 MG/ML INJ
10.0000 mg | INTRAVENOUS | Status: DC | PRN
  Filled 2015-07-07: qty 2

## 2015-07-07 MED ORDER — WITCH HAZEL-GLYCERIN EX PADS: 1.0000 "application " | MEDICATED_PAD | CUTANEOUS | Status: DC | PRN

## 2015-07-07 MED ORDER — DIBUCAINE 1 % RE OINT: 1.0000 "application " | TOPICAL_OINTMENT | RECTAL | Status: DC | PRN

## 2015-07-07 MED ORDER — ONDANSETRON HCL 4 MG/2ML IJ SOLN: 4.0000 mg | Freq: Four times a day (QID) | INTRAMUSCULAR | Status: DC | PRN

## 2015-07-07 MED ORDER — ZOLPIDEM TARTRATE 5 MG PO TABS: 5.0000 mg | ORAL_TABLET | Freq: Every evening | ORAL | Status: DC | PRN

## 2015-07-07 MED ORDER — ONDANSETRON HCL 4 MG PO TABS: 4.0000 mg | ORAL_TABLET | ORAL | Status: DC | PRN

## 2015-07-07 MED ORDER — IBUPROFEN 600 MG PO TABS
600.0000 mg | ORAL_TABLET | Freq: Four times a day (QID) | ORAL | Status: DC
  Filled 2015-07-07 (×2): qty 1

## 2015-07-07 MED ORDER — LACTATED RINGERS IV SOLN
500.0000 mL | INTRAVENOUS | Status: DC | PRN
  Administered 2015-07-07: 500 mL via INTRAVENOUS

## 2015-07-07 MED ORDER — DIPHENHYDRAMINE HCL 50 MG/ML IJ SOLN: 12.5000 mg | INTRAMUSCULAR | Status: DC | PRN

## 2015-07-07 MED ORDER — OXYTOCIN BOLUS FROM INFUSION: 500.0000 mL | INTRAVENOUS | Status: DC

## 2015-07-07 MED ORDER — PHENYLEPHRINE 40 MCG/ML (10ML) SYRINGE FOR IV PUSH (FOR BLOOD PRESSURE SUPPORT): 80.0000 ug | PREFILLED_SYRINGE | INTRAVENOUS | Status: DC | PRN

## 2015-07-07 NOTE — Anesthesia Procedure Notes (Signed)
Epidural Patient location during procedure: OB Start time: 07/07/2015 6:10 AM  Staffing Anesthesiologist: Mal Amabile Performed by: anesthesiologist   Preanesthetic Checklist Completed: patient identified, site marked, surgical consent, pre-op evaluation, timeout performed, IV checked, risks and benefits discussed and monitors and equipment checked  Epidural Patient position: sitting Prep: site prepped and draped and DuraPrep Patient monitoring: continuous pulse ox and blood pressure Approach: midline Location: L3-L4 Injection technique: LOR air  Needle:  Needle type: Tuohy  Needle gauge: 17 G Needle length: 9 cm and 9 Needle insertion depth: 5 cm cm Catheter type: closed end flexible Catheter size: 19 Gauge Catheter at skin depth: 10 cm Test dose: negative and Other  Assessment Events: blood not aspirated, injection not painful, no injection resistance, negative IV test and no paresthesia  Additional Notes Patient identified. Risks and benefits discussed including failed block, incomplete  Pain control, post dural puncture headache, nerve damage, paralysis, blood pressure Changes, nausea, vomiting, reactions to medications-both toxic and allergic and post Partum back pain. All questions were answered. Patient expressed understanding and wished to proceed. Sterile technique was used throughout procedure. Epidural site was Dressed with sterile barrier dressing. No paresthesias, signs of intravascular injection Or signs of intrathecal spread were encountered.  Patient was more comfortable after the epidural was dosed. Please see RN's note for documentation of vital signs and FHR which are stable.

## 2015-07-07 NOTE — Progress Notes (Signed)
Delivery of live, viable female by Dr. Natale Milch, assisted by Dr. Ashok Pall.

## 2015-07-07 NOTE — Progress Notes (Signed)
S:   Patient coping well; happy with epidural.   O:  VS: Blood pressure 100/68, pulse 90, temperature 98.7 F (37.1 C), temperature source Oral, resp. rate 18, height  (1.6 m), weight 73.483 kg (162 lb), last menstrual period 09/26/2014, SpO2 100 %, unknown if currently breastfeeding.        FHR : baseline 120 / variability moderate / accelerations present / decelerations no        Toco: contractions every 4 minutes          Cervix : 4/70/-3        Membranes: intact  A:  Early labor  P: -continue continuous monitoring and GBS proxphylaxis -consider AROM or pitocin if augmentation necessary -Anticipate NSVD   Marylene Land SNM 07/07/2015, 7:36 AM   Seen also by me Agree with note FHR reassuring UCs irregular, but improving Anticipate progress Aviva Signs, CNM

## 2015-07-07 NOTE — Anesthesia Preprocedure Evaluation (Signed)
Anesthesia Evaluation  Patient identified by MRN, date of birth, ID band Patient awake    Airway Mallampati: II  TM Distance: >3 FB Neck ROM: Full    Dental no notable dental hx. (+) Teeth Intact   Pulmonary neg pulmonary ROS,  breath sounds clear to auscultation  Pulmonary exam normal       Cardiovascular negative cardio ROS Normal cardiovascular examRhythm:Regular Rate:Normal     Neuro/Psych negative neurological ROS  negative psych ROS   GI/Hepatic Neg liver ROS, GERD-  ,  Endo/Other  negative endocrine ROS  Renal/GU negative Renal ROS  negative genitourinary   Musculoskeletal negative musculoskeletal ROS (+)   Abdominal   Peds  Hematology  (+) anemia ,   Anesthesia Other Findings   Reproductive/Obstetrics (+) Pregnancy                             Anesthesia Physical Anesthesia Plan  ASA: II  Anesthesia Plan: Epidural   Post-op Pain Management:    Induction:   Airway Management Planned: Natural Airway  Additional Equipment:   Intra-op Plan:   Post-operative Plan:   Informed Consent: I have reviewed the patients History and Physical, chart, labs and discussed the procedure including the risks, benefits and alternatives for the proposed anesthesia with the patient or authorized representative who has indicated his/her understanding and acceptance.     Plan Discussed with: Anesthesiologist  Anesthesia Plan Comments:         Anesthesia Quick Evaluation

## 2015-07-07 NOTE — H&P (Signed)
Jenna Duncan is a 34 y.o. female presenting for Labor.  Started contracting more during the day, progressively more frequently . Had some bloody show earlier today. Denies leaking of fluid.   Maternal Medical History:  Reason for admission: Contractions.  Nausea.  Contractions: Onset was yesterday.   Frequency: irregular.   Perceived severity is moderate.    Fetal activity: Perceived fetal activity is normal.   Last perceived fetal movement was within the past hour.    Prenatal complications: Bleeding (show).   No PIH, oligohydramnios, placental abnormality, polyhydramnios, pre-eclampsia or preterm labor.   Prenatal Complications - Diabetes: none.    OB History    Gravida Para Term Preterm AB TAB SAB Ectopic Multiple Living   History reviewed. No pertinent past medical history. Past Surgical History  Procedure Laterality Date  . No past surgeries     Family History: family history is not on file. Social History:  reports that she has never smoked. She does not have any smokeless tobacco history on file. She reports that she does not drink alcohol or use illicit drugs.   Prenatal Transfer Tool  Maternal Diabetes: No Genetic Screening: Declined Maternal Ultrasounds/Referrals: Normal Fetal Ultrasounds or other Referrals:  None Maternal Substance Abuse:  No Significant Maternal Medications:  None Significant Maternal Lab Results:  Lab values include: Group B Strep positive Other Comments:  None  Review of Systems  Constitutional: Negative for fever, chills and malaise/fatigue.  Gastrointestinal: Positive for abdominal pain. Negative for nausea, vomiting, diarrhea and constipation.  Musculoskeletal: Positive for back pain.  Neurological: Negative for dizziness, weakness and headaches.    Dilation: 4.5 Effacement (%): 70 Station: -3 Exam by:: D Simpson RN Blood pressure 105/67, pulse 95, resp. rate 16, height  (1.6 m), weight 162 lb (73.483 kg),  last menstrual period 09/26/2014, SpO2 100 %, unknown if currently breastfeeding. Maternal Exam:  Uterine Assessment: Contraction strength is moderate.  Contraction frequency is irregular.   Abdomen: Patient reports no abdominal tenderness. Fundal height is 38.   Estimated fetal weight is 7.   Fetal presentation: vertex  Introitus: Normal vulva. Vagina is positive for vaginal discharge (old bloody show).  Ferning test: not done.  Nitrazine test: not done. Amniotic fluid character: not assessed.  Pelvis: adequate for delivery.   Cervix: Cervix evaluated by digital exam.     Fetal Exam Fetal Monitor Review: Mode: ultrasound.   Baseline rate: 135.  Variability: moderate (6-25 bpm).   Pattern: accelerations present and no decelerations.    Fetal State Assessment: Category I - tracings are normal.     Physical Exam  Constitutional: She is oriented to person, place, and time. She appears well-developed and well-nourished. No distress.  HENT:  Head: Normocephalic.  Cardiovascular: Normal rate and regular rhythm.   Respiratory: Effort normal. No respiratory distress.  GI: Soft. She exhibits no distension and no mass. There is no tenderness. There is no rebound and no guarding.  Genitourinary: Vaginal discharge (old bloody show) found.  Cervix has progressed from 1-2cm yesterday, to current  Dilation: 4.5 Effacement (%): 70 Cervical Position: Posterior Station: -3 Presentation: Vertex Exam by:: D Simpson RN   Musculoskeletal: Normal range of motion.  Neurological: She is alert and oriented to person, place, and time.  Skin: Skin is warm and dry.  Psychiatric: She has a normal mood and affect.    Prenatal labs: ABO, Rh: O/POS/-- (02/03 1451) Antibody: NEG (02/03  1451) Rubella: 1.41 (02/03 1451) RPR: NON REAC (05/31 1414)  HBsAg: NEGATIVE (02/03 1451)  HIV: NONREACTIVE (05/31 1414)  GBS: Positive (07/29 0000)   Assessment/Plan: A:  SIUP at [redacted]w[redacted]d        Active labor        GBS positive  P:  Admit to [facility]       Routine orders        Penicillin prophylaxis       Anticipate SVD   [facility] 07/07/2015, 4:51 AM

## 2015-07-08 LAB — CBC
HEMATOCRIT: 30.8 % — AB (ref 36.0–46.0)
Hemoglobin: 10.1 g/dL — ABNORMAL LOW (ref 12.0–15.0)
MCH: 29.3 pg (ref 26.0–34.0)
MCHC: 32.8 g/dL (ref 30.0–36.0)
MCV: 89.3 fL (ref 78.0–100.0)
Platelets: 197 10*3/uL (ref 150–400)
RBC: 3.45 MIL/uL — ABNORMAL LOW (ref 3.87–5.11)
RDW: 14.2 % (ref 11.5–15.5)
WBC: 9.8 10*3/uL (ref 4.0–10.5)

## 2015-07-08 MED ORDER — IBUPROFEN 600 MG PO TABS: 600.0000 mg | ORAL_TABLET | Freq: Four times a day (QID) | ORAL | Status: AC | PRN

## 2015-07-08 NOTE — Discharge Instructions (Signed)

## 2015-07-08 NOTE — Anesthesia Postprocedure Evaluation (Signed)
  Anesthesia Post-op Note  Patient: Jenna Duncan  Procedure(s) Performed: * No procedures listed *  Patient Location: Mother/Baby  Anesthesia Type:Epidural  Level of Consciousness: awake, alert , oriented and patient cooperative  Airway and Oxygen Therapy: Patient Spontanous Breathing  Post-op Pain: mild  Post-op Assessment: Patient's Cardiovascular Status Stable, Respiratory Function Stable, No headache, No backache and Patient able to bend at knees              Post-op Vital Signs: Reviewed and stable  Last Vitals:  Filed Vitals:   07/08/15 0609  BP: 98/66  Pulse: 78  Temp: 36.7 C  Resp: 18    Complications: No apparent anesthesia complications

## 2015-07-08 NOTE — Lactation Note (Signed)
This note was copied from the chart of Jenna Duncan. Lactation Consultation Note  Patient Name: Jenna Duncan [id number] Date: 07/08/2015 Reason for consult: Initial assessment  Baby is 46 hours old and going home early . Mom is an experienced breast feeder of 2 others per mom .  Mom denies soreness at this point. Sore nipple and engorgement prevention and tx reviewed .  Mom instructed on the use hand pump, cleaning , and storage of EBM.  Baby already latched when LC walked in the room , mom independent with latch, swallows noted. Mother informed of post-discharge support and given phone number to the lactation department, including services  for phone call assistance; out-patient appointments; and breastfeeding support group. List of other breastfeeding resources  in the community given in the handout. Encouraged mother to call for problems or concerns related to breastfeeding.   Maternal Data Has patient been taught Hand Expression?: Yes Does the patient have breastfeeding experience prior to this delivery?: Yes  Feeding Feeding Type: Breast Fed Length of feed: 25 min  LATCH Score/Interventions Latch:  (latched with depth )  Audible Swallowing:  (multiply swallows, )     Comfort (Breast/Nipple):  (per mom comfortable )     Hold (Positioning):  (mom independent with latching )     Lactation Tools Discussed/Used Tools: Pump Breast pump type: Manual Pump Review: Setup, frequency, and cleaning;Milk Storage Initiated by:: MAI  Date initiated:: 07/08/15   Consult Status Consult Status: Complete    Kathrin Greathouse 07/08/2015, 6:30 PM

## 2015-07-08 NOTE — Discharge Summary (Signed)
Obstetric Discharge Summary Reason for Admission: onset of labor Prenatal Procedures: ultrasound Intrapartum Procedures: spontaneous vaginal delivery Postpartum Procedures: none Complications-Operative and Postpartum: 2nd degree perineal laceration  Delivery Note At 2:42 PM a viable female was delivered via Vaginal, Spontaneous Delivery (Presentation: Right Occiput Anterior). APGAR: 9, 9; weight not yet measured.  Placenta status: Intact, Spontaneous. Cord: 3 vessels with the following complications: None. Cord pH: not sent.  Anesthesia: Epidural  Episiotomy: None Lacerations: 2nd degree Suture Repair: 3.0 vicryl Est. Blood Loss (mL): 125  Mom to postpartum. Baby to Couplet care / Skin to Skin.  Hospital Course:  Active Problems:   Active labor at term   Jenna Duncan is a 34 y.o. [id number] s/p SVD.  Patient was admitted for onset of labor.  She has postpartum course that was uncomplicated including no problems with ambulating, PO intake, urination, pain, or bleeding. The pt feels ready to go home and  will be discharged with outpatient follow-up.   Today: No acute events overnight.  Pt denies problems with ambulating, voiding or po intake.  She denies nausea or vomiting.  Pain is well controlled.  She has had flatus. She has not had bowel movement.  Lochia Small.  Plan for birth control is  still undecided. Patient and husband say they will make a decision by the time of her 6 wk follow-up..  Method of Feeding: Breast  Physical Exam:  General: alert, cooperative and no distress Lochia: appropriate Uterine Fundus: firm DVT Evaluation: No evidence of DVT seen on physical exam.  H/H: Lab Results  Component Value Date/Time   HGB 10.1* 07/08/2015 05:29 AM   HCT 30.8* 07/08/2015 05:29 AM    Discharge Diagnoses: Term Pregnancy-delivered  Discharge Information: Date: 07/08/2015 Activity: pelvic rest Diet: routine  Medications: Ibuprofen Breast feeding:  Yes Condition:  stable Discharge to: home     Medication List    TAKE these medications        ibuprofen 600 MG tablet  Commonly known as:  ADVIL,MOTRIN  Take 1 tablet (600 mg total) by mouth every 6 (six) hours as needed.     Prenatal Vitamins 0.8 MG tablet  Take 1 tablet by mouth daily.         Federico Flake ,MD PGY-1 [facility]   07/08/2015,10:15 AM  [facility] fellow attestation Post Partum Day 1 I have seen and examined this patient and agree with above documentation in the resident's note.   Jenna Duncan is a 34 y.o. [id number] s/p NVSD.  Pt denies problems with ambulating, voiding or po intake. Pain is well controlled.  Plan for birth control is no method- discussed at length.  Method of Feeding: breast  PE:  BP 98/66 mmHg  Pulse 78  Temp(Src) 98 F (36.7 C) (Oral)  Resp 18  Ht  (1.6 m)  Wt 162 lb (73.483 kg)  BMI 28.70 kg/m2  SpO2 100%  LMP 09/26/2014  Breastfeeding? Unknown Fundus firm  Plan for discharge: Today. Routine PP care  Federico Flake, MD 10:15 AM

## 2015-07-09 ENCOUNTER — Encounter: Payer: BLUE CROSS/BLUE SHIELD | Admitting: Family

## 2015-07-10 ENCOUNTER — Inpatient Hospital Stay (HOSPITAL_COMMUNITY): Admission: RE | Admit: 2015-07-10 | Payer: BLUE CROSS/BLUE SHIELD | Source: Ambulatory Visit

## 2015-07-15 ENCOUNTER — Telehealth: Payer: Self-pay | Admitting: *Deleted

## 2015-07-15 NOTE — Telephone Encounter (Signed)
Received call from [facility] supply stating that pt is ordering a breast pump from them. They need to verify that Dr. Adrian Blackwater is the correct physician to sign off on the order. The reference # is [id number]. Per chart review, the discharge summary was completed by Dr. Alvester Morin and Dr. Jolayne Panther was the co-signer. I returned the call and gave the information to use Dr. Jolayne Panther as the prescribing physician. They will fax an order to be signed by her today and we will fax it back.

## 2015-08-03 IMAGING — US US OB COMP LESS 14 WK
1 series · 14 of 22 positions shown · non-contrast
Comparison: None.

CLINICAL DATA: Pregnant, right pelvic pain x2 weeks, for viability

EXAM:
OBSTETRIC <14 WK ULTRASOUND
TECHNIQUE: Transabdominal ultrasound was performed for evaluation of the
gestation as well as the maternal uterus and adnexal regions.

[Series 1: us ob comp less 14 wks · 14 of 22 slices shown]
[im 1/22]
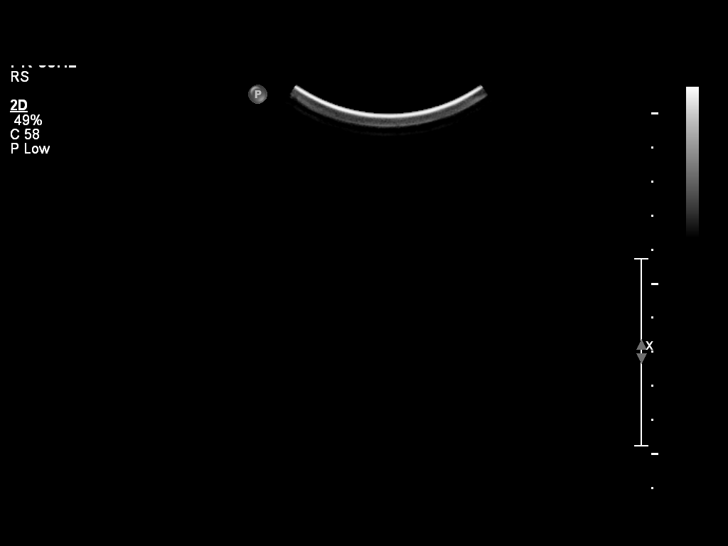
[im 3/22]
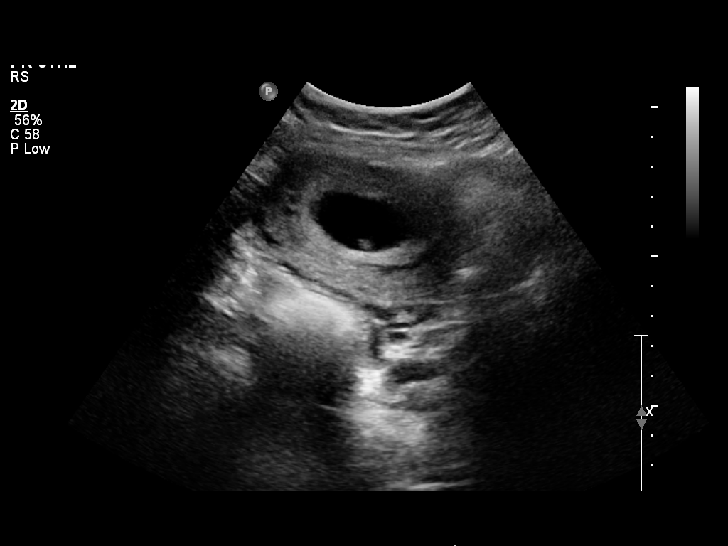
[im 4/22]
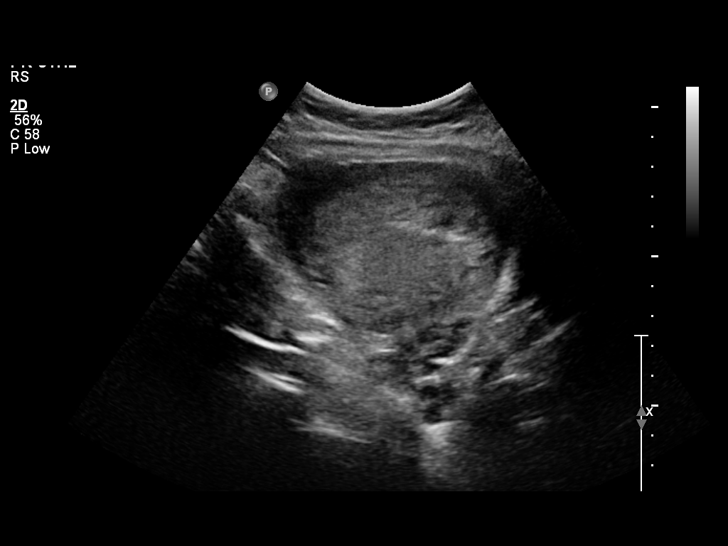
[im 6/22]
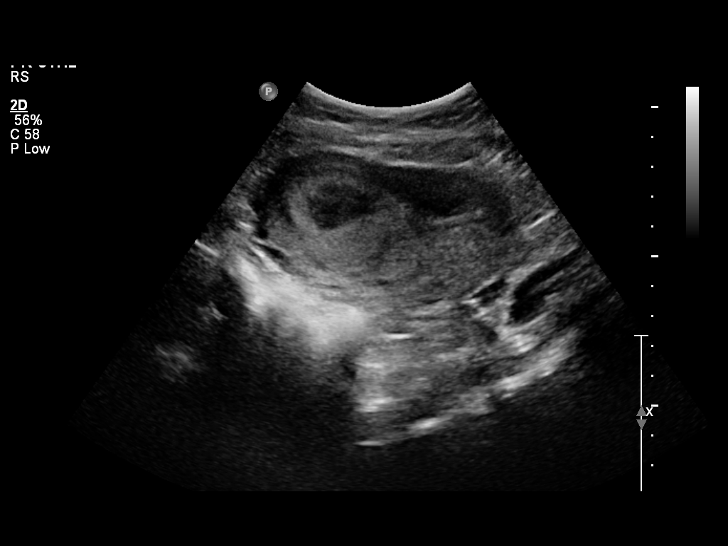
[im 8/22]
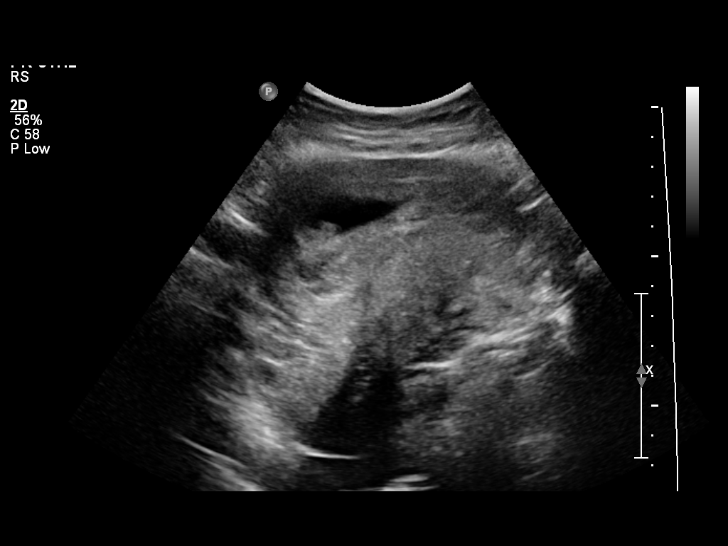
[im 9/22]
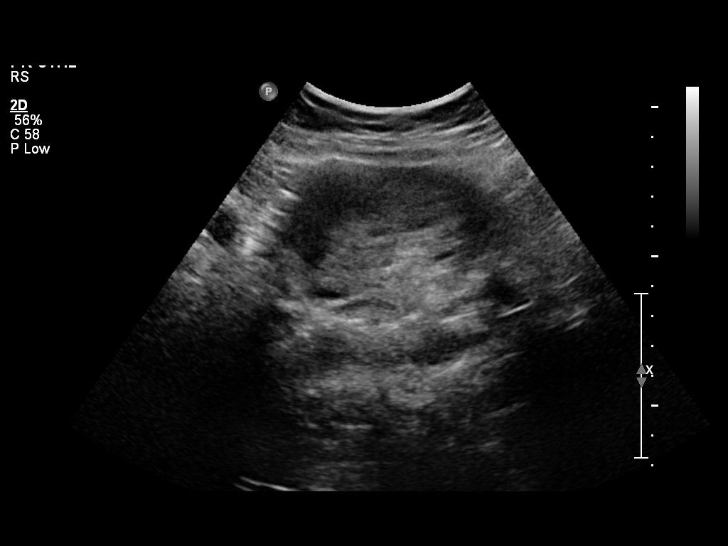
[im 11/22]
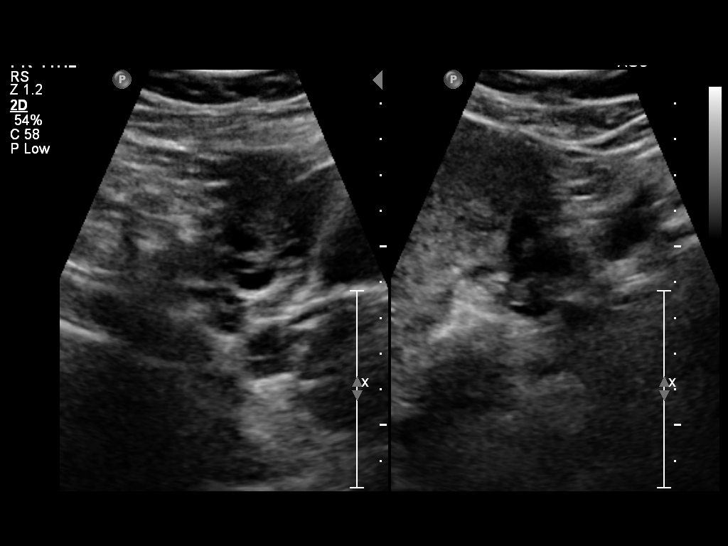
[im 12/22]
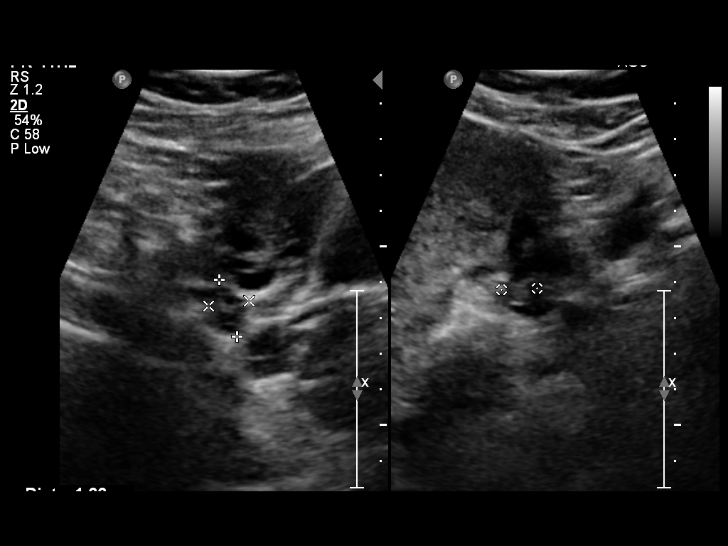
[im 14/22]
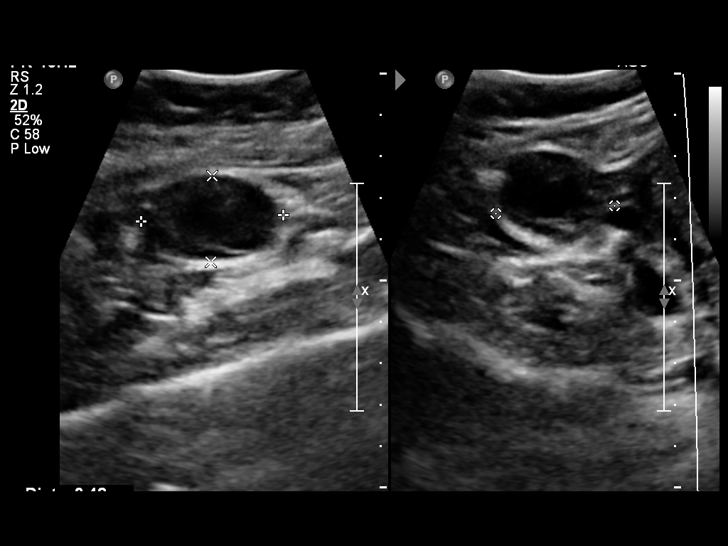
[im 15/22]
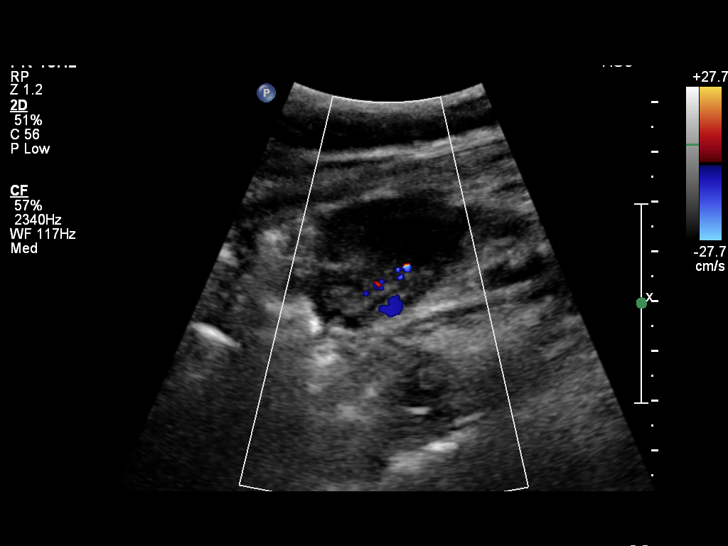
[im 17/22]
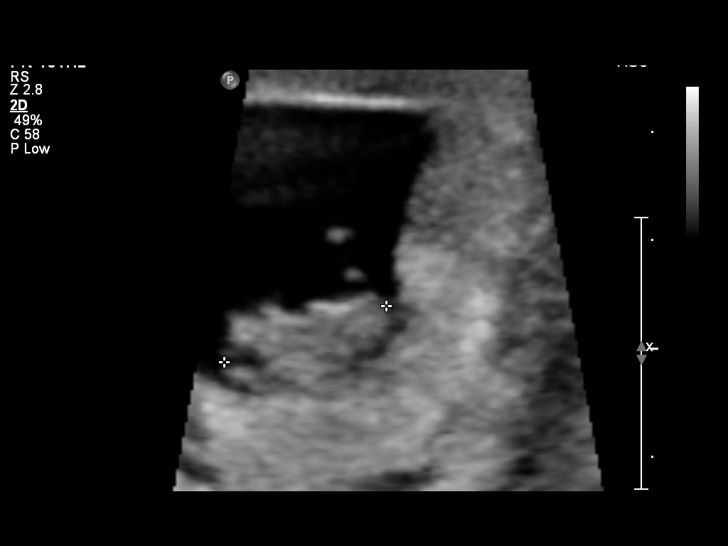
[im 19/22]
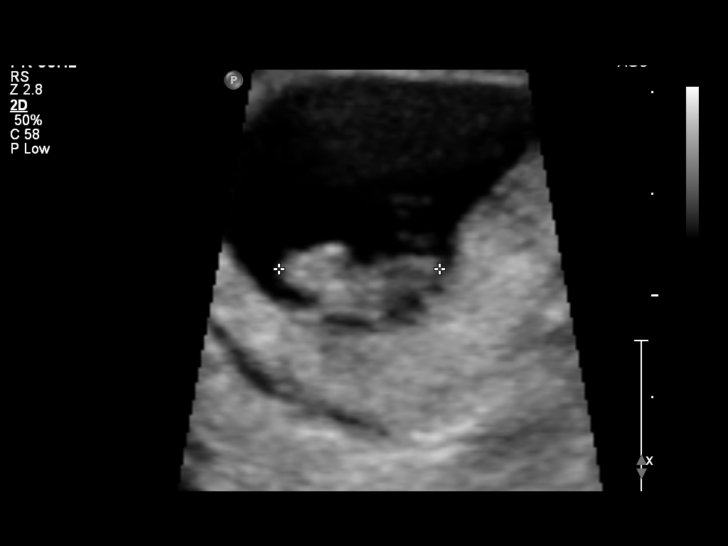
[im 20/22]
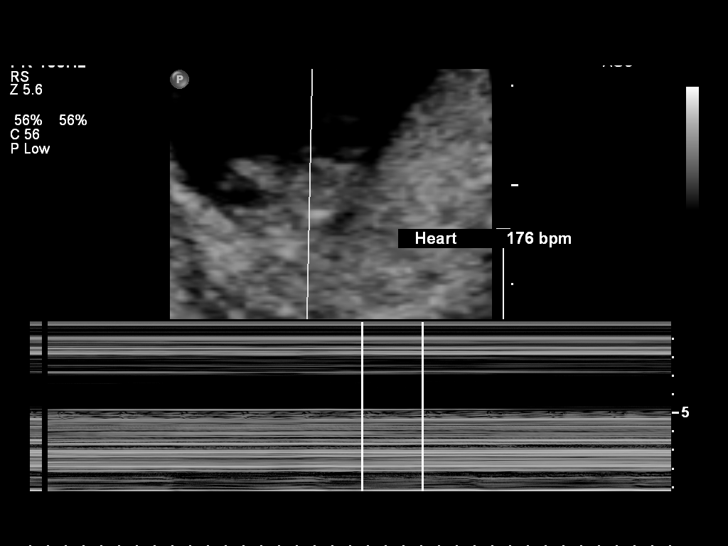
[im 22/22]
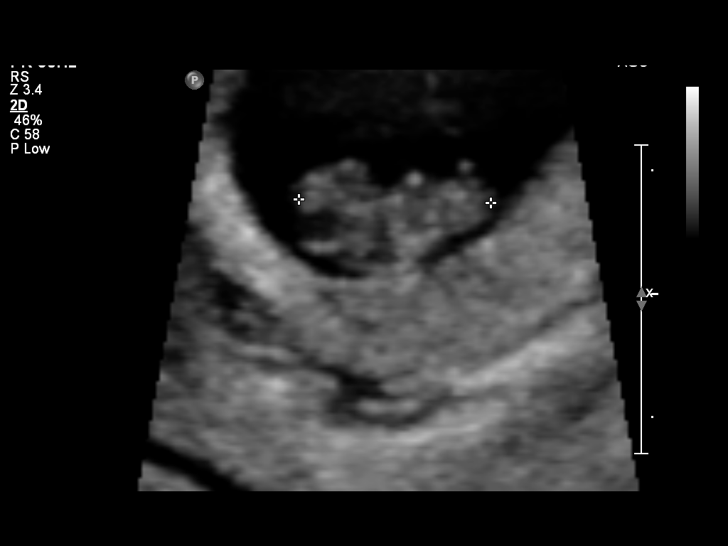

[14 of 22 positions shown; findings below may reference images not displayed]

FINDINGS: Intrauterine gestational sac: Visualized/normal in shape.

Yolk sac:  Present

Embryo:  Present

Cardiac Activity: Present

Heart Rate: 176 bpm

CRL:   15.7  mm   8 w 0 d                  US EDC: 07/02/2015

Maternal uterus/adnexae: Small subchorionic hemorrhage.

Bilateral ovaries are within normal limits, noting a right corpus
luteal cyst.

No free fluid.
IMPRESSION: Single live intrauterine gestation with estimated gestational age 8
weeks 0 days by crown-rump length.

## 2015-08-14 ENCOUNTER — Encounter: Payer: Self-pay | Admitting: Obstetrics & Gynecology

## 2015-08-14 ENCOUNTER — Ambulatory Visit: Payer: BLUE CROSS/BLUE SHIELD | Admitting: Family Medicine

## 2015-08-14 ENCOUNTER — Ambulatory Visit (INDEPENDENT_AMBULATORY_CARE_PROVIDER_SITE_OTHER): Payer: BLUE CROSS/BLUE SHIELD | Admitting: Obstetrics & Gynecology

## 2015-08-14 NOTE — Patient Instructions (Signed)
Breastfeeding and Inducing Lactation Induced lactation is using hormones or other medicines and breast stimulation to help you produce breast milk. You may want to try induced lactation if you:  Are adopting a baby.   Are having a surrogate mother carry your baby.   Have to stop breastfeeding for a period of time.  HOW DOES IT WORK? During pregnancy, your hormones change to prepare your body to produce breast milk. After pregnancy, hormones signal your body to start making breast milk to feed your baby. When you do not go through these changes, it may be hard for you to produce enough breast milk to feed your baby. Your health care provider and a lactation consultant can help you produce milk using medicine and breast stimulation.  WILL I MAKE ENOUGH MILK TO FEED MY BABY? Induced lactation may be successful. However, very few women who use induced lactation to produce breast milk can make all the milk their baby needs. You may need to feed your baby with donated breast milk or infant formula in addition to your breast milk. This will make sure your baby gets adequate nutrition. Induced lactation is usually more successful if you have been pregnant before.  HOW DOES MY BODY PRODUCE MILK? To induce lactation, you will start taking hormones 3-4 months before you want to start breastfeeding. You will continue to take them until about 6 weeks before the baby arrives. When you stop taking the hormones, you will need to perform breast stimulation a number of times per day to encourage breast milk production. Breast stimulation can be performed by gently rubbing and stretching the nipple tissue. Breast stimulation can also be performed using a double electric hospital-grade pump to mimic a baby's suckling at the breast. Try to pump every 3 hours (8 times a day) for 20 minutes on each breast. If you are unable to pump that many times each day, do it as often as possible. This helps your body continue to make  milk. When you put your baby to your breast, your body may also respond to the smell, sound, and feel of your baby by increasing the amount of milk you produce.  Your health care provider may also prescribe medicine to stimulate lactation and increase your milk supply. Herbal medicines are also available to try to induce lactation. It is important to know that these medicines are not approved or regulated by the FDA. Always check with your health care provider before using any herbal medicine. WHAT ELSE DO I NEED TO KNOW?  You should only take hormones and medicines as directed by your health care provider or trained lactation consultant.  Talk to your health care provider or lactation consultant if you need guidance. He or she may be able to help you start a milk supply and advise you as you make important decisions about nourishing your baby.  Try using a supplemental nursing system to provide extra donated breast milk or formula at the breast while the baby nurses. This ensures that your infant gets enough nutrition while you are breastfeeding. Ask a lactation specialist to help you find and use this device. Document Released: 10/31/2005 Document Revised: 11/05/2013 Document Reviewed: 08/23/2013 ExitCare Patient Information 2015 [employer]. This information is not intended to replace advice given to you by your health care provider. Make sure you discuss any questions you have with your health care provider.  

## 2015-08-14 NOTE — Progress Notes (Signed)
Subjective:     Jenna Duncan is a 34 y.o. female who presents for a postpartum visit. She is 5 weeks postpartum following a spontaneous vaginal delivery. I have fully reviewed the prenatal and intrapartum course. The delivery was at [redacted]w[redacted]d gestational weeks. Outcome: spontaneous vaginal delivery. Anesthesia: epidural. Postpartum course has been uncomplicated. Baby's course has been unremarkable. Baby is feeding by breast. Bleeding no bleeding. Bowel function is normal. Bladder function is normal. Patient is not sexually active. Contraception method is none. Postpartum depression screening: negative.  The following portions of the patient's history were reviewed and updated as appropriate: allergies, current medications, past family history, past medical history, past social history, past surgical history and problem list.  Review of Systems A comprehensive review of systems was negative.   Objective:  BP 94/65 mmHg  Pulse 69  Wt 146 lb 4.8 oz (66.361 kg)  Breastfeeding? Yes Abd; Soft, NT, ND GU: EGBUS: no lesions; introitus healing well Vagina: no blood in vault Cervix: no lesion; no mucopurulent d/c Uterus: small, mobile Adnexa: no masses; non tender           Assessment:     5 postpartum exam. Pap smear not done at today's visit.   Doing well. No contraception desired  Plan:    1. Contraception: abstinence 2.  Follow up in: 1 year or as needed.    Carolyn L. Harraway-Smith, M.D., [facility]

## 2017-05-23 ENCOUNTER — Encounter: Payer: Self-pay | Admitting: Pediatrics

## 2017-05-23 ENCOUNTER — Ambulatory Visit (INDEPENDENT_AMBULATORY_CARE_PROVIDER_SITE_OTHER): Payer: BLUE CROSS/BLUE SHIELD | Admitting: Pediatrics

## 2017-05-23 VITALS — BP 112/70 | HR 69 | Temp 98.2°F | Resp 16 | Ht 62.99 in | Wt 148.4 lb

## 2017-05-23 DIAGNOSIS — J45909 Unspecified asthma, uncomplicated: Secondary | ICD-10-CM | POA: Insufficient documentation

## 2017-05-23 DIAGNOSIS — J3089 Other allergic rhinitis: Secondary | ICD-10-CM | POA: Insufficient documentation

## 2017-05-23 DIAGNOSIS — J4530 Mild persistent asthma, uncomplicated: Secondary | ICD-10-CM

## 2017-05-23 MED ORDER — ALBUTEROL SULFATE HFA 108 (90 BASE) MCG/ACT IN AERS: INHALATION_SPRAY | RESPIRATORY_TRACT | 1 refills | Status: AC

## 2017-05-23 MED ORDER — FLUTICASONE PROPIONATE 50 MCG/ACT NA SUSP: NASAL | 5 refills | Status: DC

## 2017-05-23 MED ORDER — MONTELUKAST SODIUM 10 MG PO TABS: ORAL_TABLET | ORAL | 5 refills | Status: DC

## 2017-05-23 NOTE — Patient Instructions (Signed)
Environmental control of dust mite Zyrtec 10 mg once a day for runny nose or itchy eyes Fluticasone 2 sprays per nostril once a day for stuffy nose Montelukast 10 mg-take 1 tablet once a day for coughing or wheezing Pro-air 2 puffs every 4 hours if needed for wheezing or coughing spells Add prednisone 20 mg twice a day for 3 days, 20 mg on the fourth day, 10 mg on the fifth day to help your  breathing Call me if you are not doing well on this treatment plan
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
## 2017-05-30 ENCOUNTER — Ambulatory Visit: Payer: BLUE CROSS/BLUE SHIELD | Admitting: Pediatrics

## 2017-06-22 ENCOUNTER — Ambulatory Visit: Payer: BLUE CROSS/BLUE SHIELD | Admitting: Allergy

## 2017-07-11 ENCOUNTER — Ambulatory Visit: Payer: BLUE CROSS/BLUE SHIELD | Admitting: Pediatrics

## 2018-06-25 ENCOUNTER — Encounter: Payer: Self-pay | Admitting: Pediatrics

## 2018-06-25 ENCOUNTER — Ambulatory Visit (INDEPENDENT_AMBULATORY_CARE_PROVIDER_SITE_OTHER): Payer: BLUE CROSS/BLUE SHIELD | Admitting: Pediatrics

## 2018-06-25 VITALS — BP 102/60 | HR 70 | Temp 98.7°F | Resp 16 | Ht 63.0 in | Wt 138.0 lb

## 2018-06-25 DIAGNOSIS — J3089 Other allergic rhinitis: Secondary | ICD-10-CM | POA: Diagnosis not present

## 2018-06-25 DIAGNOSIS — J4530 Mild persistent asthma, uncomplicated: Secondary | ICD-10-CM | POA: Diagnosis not present

## 2018-06-25 MED ORDER — FLUTICASONE PROPIONATE 50 MCG/ACT NA SUSP: 2.0000 | Freq: Every day | NASAL | 5 refills | Status: AC | PRN

## 2018-06-25 MED ORDER — MONTELUKAST SODIUM 10 MG PO TABS: ORAL_TABLET | ORAL | 5 refills | Status: AC

## 2018-06-25 MED ORDER — PROAIR HFA 108 (90 BASE) MCG/ACT IN AERS: 2.0000 | INHALATION_SPRAY | RESPIRATORY_TRACT | 2 refills | Status: AC | PRN

## 2018-06-25 NOTE — Progress Notes (Signed)
[address] [place] [id number] Dept: [phone]  FOLLOW UP NOTE  Patient ID: Jenna Duncan, female    DOB: May 13, 1981  Age: 37 y.o. MRN: [id number] Date of Office Visit: 06/25/2018  Assessment  Chief Complaint: Shortness of Breath  HPI Tenille Welton FlakesKhan presents for follow-up of asthma and allergic rhinitis.  She was last seen in July 2018.  She did extremely well since then until the first week in August when she had some coughing , wheezing , shortness of breath and a  postnasal drainage.  She did not have a fever.  She resumed the use of montelukast 10 mg once a day and used Pro-air 2 puffs every 4 hours if needed.  She is much improved Her nasal symptoms are well controlled without having to use medications on a daily basis   Drug Allergies:  Allergies  Allergen Reactions  . Iron Swelling and Rash    Physical Exam: BP 102/60   Pulse 70   Temp 98.7 F (37.1 C) (Oral)   Resp 16   Ht 5\' 3"  (1.6 m)   Wt 138 lb (62.6 kg)   SpO2 98%   BMI 24.45 kg/m    Physical Exam  Constitutional: She is oriented to person, place, and time. She appears well-developed and well-nourished.  HENT:  Eyes normal.  Ears normal.  Nose normal.  Pharynx normal.  Neck: Neck supple.  Cardiovascular:  S1-S2 normal no murmurs  Pulmonary/Chest:  Clear to percussion and auscultation  Lymphadenopathy:    She has no cervical adenopathy.  Neurological: She is alert and oriented to person, place, and time.  Psychiatric: She has a normal mood and affect. Her behavior is normal.  Vitals reviewed.   Diagnostics: FVC 2.69 L FEV1 2.39 L.  Predicted FVC 3.30 L predicted FEV1 2.77 L-the spirometry is in the normal range  Assessment and Plan: 1. Mild persistent asthma without complication   2. Other allergic rhinitis     Meds ordered this encounter  Medications  . montelukast (SINGULAIR) 10 MG tablet    Sig: 10 MG TAKE 1 TABLET ONCE A DAY FOR COUGHING OR WHEEZING    Dispense:  30 tablet    Refill:   5  . fluticasone (FLONASE) 50 MCG/ACT nasal spray    Sig: Place 2 sprays into both nostrils daily as needed (for stuffy nose).    Dispense:  16 g    Refill:  5  . PROAIR HFA 108 (90 Base) MCG/ACT inhaler    Sig: Inhale 2 puffs into the lungs every 4 (four) hours as needed for wheezing or shortness of breath.    Dispense:  1 Inhaler    Refill:  2    Patient Instructions  Zyrtec 10 mg-take 1 tablet once a day if needed for runny nose or itchy eyes Fluticasone 2 sprays per nostril once a day if needed for stuffy nose Montelukast 10 mg-take 1 tablet once a day to prevent coughing or wheezing .  You may stop montelukast when you are  cough and wheeze free Pro-air 2 puffs every 4 hours if needed for wheezing or coughing spells Call us if you are not doing well on this treatment plan   Return in about 1 year (around 06/26/2019).    Thank you for the opportunity to care for this patient.  Please do not hesitate to contact me with questions.  Tonette BihariJ. A. Noam Franzen, M.D.  [facility] [address] [place], [place] [id number] [phone]  

## 2018-06-25 NOTE — Patient Instructions (Addendum)
Zyrtec 10 mg-take 1 tablet once a day if needed for runny nose or itchy eyes Fluticasone 2 sprays per nostril once a day if needed for stuffy nose Montelukast 10 mg-take 1 tablet once a day to prevent coughing or wheezing .  You may stop montelukast when you are  cough and wheeze free Pro-air 2 puffs every 4 hours if needed for wheezing or coughing spells Call us if you are not doing well on this treatment plan

## 2020-02-13 ENCOUNTER — Ambulatory Visit: Payer: Self-pay | Attending: Internal Medicine

## 2020-02-13 DIAGNOSIS — Z23 Encounter for immunization: Secondary | ICD-10-CM

## 2020-02-13 NOTE — Progress Notes (Signed)
   Covid-19 Vaccination Clinic  Name:  Jenna Duncan    MRN: [id number] DOB: 11-16-1980  02/13/2020  [name] was observed post Covid-19 immunization for 15 minutes without incident. She was provided with Vaccine Information Sheet and instruction to access the V-Safe system.   [name] was instructed to call 911 with any severe reactions post vaccine: Marland Kitchen Difficulty breathing  . Swelling of face and throat  . A fast heartbeat  . A bad rash all over body  . Dizziness and weakness   Immunizations Administered    Name Date Dose VIS Date Route   Pfizer COVID-19 Vaccine 02/13/2020 12:03 PM 0.3 mL 10/25/2019 Intramuscular   Manufacturer: [employer], [employer]   Lot: [id number]   NDC: [address]-3

## 2020-03-11 ENCOUNTER — Ambulatory Visit: Payer: Self-pay | Attending: Internal Medicine

## 2020-03-11 ENCOUNTER — Ambulatory Visit: Payer: Self-pay

## 2020-03-11 DIAGNOSIS — Z23 Encounter for immunization: Secondary | ICD-10-CM

## 2020-03-11 NOTE — Progress Notes (Signed)
   Covid-19 Vaccination Clinic  Name:  Reka Wist    MRN: [id number] DOB: 1981/04/13  03/11/2020  Ms. Fulgham was observed post Covid-19 immunization for 15 minutes without incident. She was provided with Vaccine Information Sheet and instruction to access the V-Safe system.   Ms. Printy was instructed to call 911 with any severe reactions post vaccine: Marland Kitchen Difficulty breathing  . Swelling of face and throat  . A fast heartbeat  . A bad rash all over body  . Dizziness and weakness   Immunizations Administered    Name Date Dose VIS Date Route   Pfizer COVID-19 Vaccine 03/11/2020  3:00 PM 0.3 mL 01/08/2019 Intramuscular   Manufacturer: [employer], [employer]   Lot: [id number]   NDC: [address]-9
